# Patient Record
Sex: Female | Born: 1985 | Race: Black or African American | Hispanic: No | Marital: Married | Smoking: Never smoker
Health system: Southern US, Community
[De-identification: ages and names within clinical notes are randomized; demographics above are authoritative.]

## PROBLEM LIST (undated history)

## (undated) ENCOUNTER — Inpatient Hospital Stay (HOSPITAL_COMMUNITY): Payer: Self-pay

## (undated) ENCOUNTER — Emergency Department (HOSPITAL_COMMUNITY): Payer: No Typology Code available for payment source

## (undated) DIAGNOSIS — Z789 Other specified health status: Secondary | ICD-10-CM

## (undated) DIAGNOSIS — Z9889 Other specified postprocedural states: Secondary | ICD-10-CM

## (undated) HISTORY — PX: BREAST SURGERY: SHX581

---

## 2013-08-02 ENCOUNTER — Emergency Department (HOSPITAL_COMMUNITY): Payer: Medicaid Other

## 2013-08-02 ENCOUNTER — Encounter (HOSPITAL_COMMUNITY): Payer: Self-pay | Admitting: Physical Medicine and Rehabilitation

## 2013-08-02 ENCOUNTER — Emergency Department (HOSPITAL_COMMUNITY)
Admission: EM | Admit: 2013-08-02 | Discharge: 2013-08-02 | Disposition: A | Discharge status: Home / Self Care | Payer: Medicaid Other | Attending: Emergency Medicine | Admitting: Emergency Medicine

## 2013-08-02 DIAGNOSIS — S298XXA Other specified injuries of thorax, initial encounter: Secondary | ICD-10-CM | POA: Insufficient documentation

## 2013-08-02 DIAGNOSIS — S161XXA Strain of muscle, fascia and tendon at neck level, initial encounter: Secondary | ICD-10-CM

## 2013-08-02 DIAGNOSIS — Y92410 Unspecified street and highway as the place of occurrence of the external cause: Secondary | ICD-10-CM | POA: Insufficient documentation

## 2013-08-02 DIAGNOSIS — Y9389 Activity, other specified: Secondary | ICD-10-CM | POA: Insufficient documentation

## 2013-08-02 DIAGNOSIS — S139XXA Sprain of joints and ligaments of unspecified parts of neck, initial encounter: Secondary | ICD-10-CM | POA: Insufficient documentation

## 2013-08-02 DIAGNOSIS — S3981XA Other specified injuries of abdomen, initial encounter: Secondary | ICD-10-CM | POA: Insufficient documentation

## 2013-08-02 DIAGNOSIS — Z3202 Encounter for pregnancy test, result negative: Secondary | ICD-10-CM | POA: Insufficient documentation

## 2013-08-02 DIAGNOSIS — S8000XA Contusion of unspecified knee, initial encounter: Secondary | ICD-10-CM | POA: Insufficient documentation

## 2013-08-02 DIAGNOSIS — S8002XA Contusion of left knee, initial encounter: Secondary | ICD-10-CM

## 2013-08-02 LAB — CBC WITH DIFFERENTIAL/PLATELET
Basophils Absolute: 0 10*3/uL (ref 0.0–0.1)
Hemoglobin: 11.8 g/dL — ABNORMAL LOW (ref 12.0–15.0)
Lymphocytes Relative: 30 % (ref 12–46)
Lymphs Abs: 1.3 10*3/uL (ref 0.7–4.0)
Neutro Abs: 2.5 10*3/uL (ref 1.7–7.7)
Neutrophils Relative %: 59 % (ref 43–77)
Platelets: 241 10*3/uL (ref 150–400)
RBC: 3.7 MIL/uL — ABNORMAL LOW (ref 3.87–5.11)
RDW: 12.5 % (ref 11.5–15.5)
WBC: 4.2 10*3/uL (ref 4.0–10.5)

## 2013-08-02 LAB — POCT I-STAT, CHEM 8
Calcium, Ion: 1.2 mmol/L (ref 1.12–1.23)
Chloride: 104 mEq/L (ref 96–112)
Glucose, Bld: 84 mg/dL (ref 70–99)
HCT: 36 % (ref 36.0–46.0)
Hemoglobin: 12.2 g/dL (ref 12.0–15.0)
Potassium: 3.9 mEq/L (ref 3.5–5.1)

## 2013-08-02 LAB — URINALYSIS, ROUTINE W REFLEX MICROSCOPIC
Bilirubin Urine: NEGATIVE
Hgb urine dipstick: NEGATIVE
Leukocytes, UA: NEGATIVE
Nitrite: NEGATIVE
Specific Gravity, Urine: 1.017 (ref 1.005–1.030)
Urobilinogen, UA: 0.2 mg/dL (ref 0.0–1.0)
pH: 5.5 (ref 5.0–8.0)

## 2013-08-02 LAB — PREGNANCY, URINE: Preg Test, Ur: NEGATIVE

## 2013-08-02 MED ORDER — SODIUM CHLORIDE 0.9 % IV BOLUS (SEPSIS)
500.0000 mL | Freq: Once | INTRAVENOUS | Status: AC
  Administered 2013-08-02: 500 mL via INTRAVENOUS

## 2013-08-02 MED ORDER — OXYCODONE-ACETAMINOPHEN 5-325 MG PO TABS: 1.0000 | ORAL_TABLET | Freq: Four times a day (QID) | ORAL | Status: DC | PRN

## 2013-08-02 MED ORDER — IBUPROFEN 600 MG PO TABS: 600.0000 mg | ORAL_TABLET | Freq: Four times a day (QID) | ORAL | Status: DC | PRN

## 2013-08-02 MED ORDER — IOHEXOL 300 MG/ML  SOLN
100.0000 mL | Freq: Once | INTRAMUSCULAR | Status: AC | PRN
  Administered 2013-08-02: 100 mL via INTRAVENOUS
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
## 2013-11-05 NOTE — L&D Delivery Note (Signed)
Delivery Note At 9:43 PM a viable and healthy female was delivered via Vaginal, Spontaneous Delivery (Presentation: Left Occiput Anterior).  APGAR: 8, 9; weight  P.   Placenta status: Intact, Spontaneous.  Cord: 3 vessels with the following complications: None.    Anesthesia: Epidural  Episiotomy: None Lacerations: 2nd degree;Perineal Suture Repair: 3.0 vicryl rapide Est. Blood Loss (mL): 400  Mom to postpartum.  Baby to Couplet care / Skin to Skin.  Bovard-Stuckert, Rachel Atkinson 10/18/2014, 10:07 PM  O+/RI/Tdap in PNC/Br/ Contra?

## 2014-01-09 ENCOUNTER — Inpatient Hospital Stay (HOSPITAL_COMMUNITY): Payer: Medicaid Other

## 2014-01-09 ENCOUNTER — Inpatient Hospital Stay (HOSPITAL_COMMUNITY)
Admission: AD | Admit: 2014-01-09 | Discharge: 2014-01-09 | Disposition: A | Discharge status: Home / Self Care | Payer: Self-pay | Source: Ambulatory Visit | Attending: Obstetrics & Gynecology | Admitting: Obstetrics & Gynecology

## 2014-01-09 ENCOUNTER — Encounter (HOSPITAL_COMMUNITY): Payer: Self-pay

## 2014-01-09 DIAGNOSIS — M545 Low back pain, unspecified: Secondary | ICD-10-CM | POA: Insufficient documentation

## 2014-01-09 DIAGNOSIS — O4690 Antepartum hemorrhage, unspecified, unspecified trimester: Secondary | ICD-10-CM

## 2014-01-09 DIAGNOSIS — N9489 Other specified conditions associated with female genital organs and menstrual cycle: Secondary | ICD-10-CM

## 2014-01-09 DIAGNOSIS — O26859 Spotting complicating pregnancy, unspecified trimester: Secondary | ICD-10-CM | POA: Insufficient documentation

## 2014-01-09 DIAGNOSIS — R109 Unspecified abdominal pain: Secondary | ICD-10-CM | POA: Insufficient documentation

## 2014-01-09 LAB — URINE MICROSCOPIC-ADD ON

## 2014-01-09 LAB — CBC
HCT: 31.4 % — ABNORMAL LOW (ref 36.0–46.0)
Hemoglobin: 10.6 g/dL — ABNORMAL LOW (ref 12.0–15.0)
MCH: 31 pg (ref 26.0–34.0)
MCHC: 33.8 g/dL (ref 30.0–36.0)
MCV: 91.8 fL (ref 78.0–100.0)
Platelets: 271 10*3/uL (ref 150–400)
RBC: 3.42 MIL/uL — AB (ref 3.87–5.11)
RDW: 12.7 % (ref 11.5–15.5)
WBC: 3.3 10*3/uL — ABNORMAL LOW (ref 4.0–10.5)

## 2014-01-09 LAB — URINALYSIS, ROUTINE W REFLEX MICROSCOPIC
Bilirubin Urine: NEGATIVE
GLUCOSE, UA: NEGATIVE mg/dL
Ketones, ur: NEGATIVE mg/dL
Leukocytes, UA: NEGATIVE
Nitrite: NEGATIVE
PROTEIN: NEGATIVE mg/dL
Specific Gravity, Urine: 1.02 (ref 1.005–1.030)
Urobilinogen, UA: 0.2 mg/dL (ref 0.0–1.0)
pH: 5.5 (ref 5.0–8.0)

## 2014-01-09 LAB — POCT PREGNANCY, URINE: PREG TEST UR: POSITIVE — AB

## 2014-01-09 LAB — HCG, QUANTITATIVE, PREGNANCY: HCG, BETA CHAIN, QUANT, S: 117 m[IU]/mL — AB (ref <5)

## 2014-01-09 LAB — WET PREP, GENITAL
CLUE CELLS WET PREP: NONE SEEN
Trich, Wet Prep: NONE SEEN
Yeast Wet Prep HPF POC: NONE SEEN

## 2014-01-09 LAB — ABO/RH: ABO/RH(D): O POS

## 2014-01-09 NOTE — MAU Provider Note (Signed)
History     CSN: [id number]  Arrival date and time: 01/09/14 1351   First Provider Initiated Contact with Patient 01/09/14 1557      Chief Complaint  Patient presents with  . Vaginal Bleeding   HPI  Pt is G1P0 [redacted]w[redacted]d pregnant and pregnant with cramping that started last week.  Pt thought she was menstrual cramps and started Aleve. Pt missed her period for Feb with LMp 11/08/2013 and took 3 pregnancy tests - Wed , 2 Thursday.  Today pt started spotting today. Pt had IC last week. Pt has frequent urination and frequent bowel movements.  Pt has lower back pain that started this week.   History reviewed. No pertinent past medical history.  Past Surgical History  Procedure Laterality Date  . Breast surgery Left     History reviewed. No pertinent family history.  History  Substance Use Topics  . Smoking status: Never Smoker   . Smokeless tobacco: Not on file  . Alcohol Use: No    Allergies: No Known Allergies  Prescriptions prior to admission  Medication Sig Dispense Refill  . naproxen sodium (ANAPROX) 220 MG tablet Take 440 mg by mouth 3 (three) times daily as needed (pain).        Review of Systems  Constitutional: Negative for fever and chills.  Gastrointestinal: Positive for abdominal pain. Negative for nausea, vomiting, diarrhea and constipation.  Genitourinary: Negative for dysuria.  Musculoskeletal: Positive for back pain.   Physical Exam   Blood pressure 114/55, pulse 52, temperature 98.7 F (37.1 C), temperature source Oral, resp. rate 18, height 5\' 6"  (1.676 m), weight 69.4 kg (153 lb), last menstrual period 11/08/2013.  Physical Exam  Vitals reviewed. Constitutional: She is oriented to person, place, and time. She appears well-developed and well-nourished. No distress.  HENT:  Head: Normocephalic.  Eyes: Pupils are equal, round, and reactive to light.  Neck: Normal range of motion. Neck supple.  Respiratory: Effort normal and breath sounds normal.  GI:  Soft. She exhibits no distension. There is no tenderness. There is no rebound.  Genitourinary:  Small amount of bright red blood in vault; cervix close, NT; uterus NSSC NT; adnexa without palpable enlargment or tenderness  Musculoskeletal: Normal range of motion.  Neurological: She is alert and oriented to person, place, and time.  Skin: Skin is warm and dry.  Psychiatric: She has a normal mood and affect.    MAU Course  Procedures Results for orders placed during the hospital encounter of 01/09/14 (from the past 24 hour(s))  URINALYSIS, ROUTINE W REFLEX MICROSCOPIC     Status: Abnormal   Collection Time    01/09/14  3:10 PM      Result Value Ref Range   Color, Urine YELLOW  YELLOW   APPearance CLEAR  CLEAR   Specific Gravity, Urine 1.020  1.005 - 1.030   pH 5.5  5.0 - 8.0   Glucose, UA NEGATIVE  NEGATIVE mg/dL   Hgb urine dipstick LARGE (*) NEGATIVE   Bilirubin Urine NEGATIVE  NEGATIVE   Ketones, ur NEGATIVE  NEGATIVE mg/dL   Protein, ur NEGATIVE  NEGATIVE mg/dL   Urobilinogen, UA 0.2  0.0 - 1.0 mg/dL   Nitrite NEGATIVE  NEGATIVE   Leukocytes, UA NEGATIVE  NEGATIVE  URINE MICROSCOPIC-ADD ON     Status: Abnormal   Collection Time    01/09/14  3:10 PM      Result Value Ref Range   Squamous Epithelial / LPF FEW (*) RARE   WBC,  UA 0-2  <3 WBC/hpf   RBC / HPF 0-2  <3 RBC/hpf   Bacteria, UA FEW (*) RARE  POCT PREGNANCY, URINE     Status: Abnormal   Collection Time    01/09/14  3:20 PM      Result Value Ref Range   Preg Test, Ur POSITIVE (*) NEGATIVE  HCG, QUANTITATIVE, PREGNANCY     Status: Abnormal   Collection Time    01/09/14  4:15 PM      Result Value Ref Range   hCG, Beta Chain, Quant, S 117 (*) <5 mIU/mL  ABO/RH     Status: None   Collection Time    01/09/14  4:15 PM      Result Value Ref Range   ABO/RH(D) O POS    CBC     Status: Abnormal   Collection Time    01/09/14  4:15 PM      Result Value Ref Range   WBC 3.3 (*) 4.0 - 10.5 K/uL   RBC 3.42 (*) 3.87 -  5.11 MIL/uL   Hemoglobin 10.6 (*) 12.0 - 15.0 g/dL   HCT 24.431.4 (*) 01.036.0 - 27.246.0 %   MCV 91.8  78.0 - 100.0 fL   MCH 31.0  26.0 - 34.0 pg   MCHC 33.8  30.0 - 36.0 g/dL   RDW 53.612.7  64.411.5 - 03.415.5 %   Platelets 271  150 - 400 K/uL  WET PREP, GENITAL     Status: Abnormal   Collection Time    01/09/14  4:25 PM      Result Value Ref Range   Yeast Wet Prep HPF POC NONE SEEN  NONE SEEN   Trich, Wet Prep NONE SEEN  NONE SEEN   Clue Cells Wet Prep HPF POC NONE SEEN  NONE SEEN   WBC, Wet Prep HPF POC FEW (*) NONE SEEN  No results found.  [place] Ob Comp Less 14 Wks  01/09/2014   CLINICAL DATA:  Pelvic cramping and spotting. Early pregnancy. Beta HCG of 117.  EXAM: OBSTETRIC <14 WK US AND TRANSVAGINAL OB US  TECHNIQUE: Both transabdominal and transvaginal ultrasound examinations were performed for complete evaluation of the gestation as well as the maternal uterus, adnexal regions, and pelvic cul-de-sac. Transvaginal technique was performed to assess early pregnancy.  COMPARISON:  No priors.  FINDINGS: Intrauterine gestational sac: None.  Yolk sac:  None.  Embryo:  None.  Cardiac Activity: None.  Maternal uterus/adnexae: Retroverted uterus. Bilateral ovaries are normal in appearance with multiple normal follicles. Trace amount of free fluid adjacent to the left ovary. Immediately adjacent to the with left ovary there is also a small isoechoic mass measuring approximately 2.1 x 1.3 x 1.6 cm, which has some internal vascularity. Real-time imaging of this lesion was observed with direct pressure applied to the lesion with the probe, and the lesion was quite flexible; per report from the sonographer, the patient was not significantly tender over this lesion.  IMPRESSION: 1. No viable IUP identified at this time. 2. Trace amount of free fluid adjacent to the left ovary. In addition, there is a small soft tissue lesion in the left adnexal region immediately adjacent to the left ovary which appears slightly hypervascular. This  is nonspecific, but the possibility of an ectopic pregnancy in this region is difficult to entirely exclude. Clinical correlation is recommended, with consideration for repeat pelvic ultrasound in the near future should the patient's symptoms worsen.   Electronically Signed   By: Trudie Reedaniel  Entrikin  M.D.   On: 01/09/2014 18:12   US Ob Transvaginal  01/09/2014   CLINICAL DATA:  Pelvic cramping and spotting. Early pregnancy. Beta HCG of 117.  EXAM: OBSTETRIC <14 WK [place] AND TRANSVAGINAL OB US  TECHNIQUE: Both transabdominal and transvaginal ultrasound examinations were performed for complete evaluation of the gestation as well as the maternal uterus, adnexal regions, and pelvic cul-de-sac. Transvaginal technique was performed to assess early pregnancy.  COMPARISON:  No priors.  FINDINGS: Intrauterine gestational sac: None.  Yolk sac:  None.  Embryo:  None.  Cardiac Activity: None.  Maternal uterus/adnexae: Retroverted uterus. Bilateral ovaries are normal in appearance with multiple normal follicles. Trace amount of free fluid adjacent to the left ovary. Immediately adjacent to the with left ovary there is also a small isoechoic mass measuring approximately 2.1 x 1.3 x 1.6 cm, which has some internal vascularity. Real-time imaging of this lesion was observed with direct pressure applied to the lesion with the probe, and the lesion was quite flexible; per report from the sonographer, the patient was not significantly tender over this lesion.  IMPRESSION: 1. No viable IUP identified at this time. 2. Trace amount of free fluid adjacent to the left ovary. In addition, there is a small soft tissue lesion in the left adnexal region immediately adjacent to the left ovary which appears slightly hypervascular. This is nonspecific, but the possibility of an ectopic pregnancy in this region is difficult to entirely exclude. Clinical correlation is recommended, with consideration for repeat pelvic ultrasound in the near future should  the patient's symptoms worsen.   Electronically Signed   By: Trudie Reed M.D.   On: 01/09/2014 18:12   Care turned over to [facility] Rash, NP  O positive blood type Consulted with Dr. Debroah Loop; will plan to have patient return in 48 hours for repeat beta hcg.  Discussed the US findings with the patient and her husband; risks discussed at length and questions answered. Pt will return in 48 hours for repeat beta hcg and sooner if pain increases or changes.   Assessment and Plan   A:  1. Vaginal bleeding in pregnancy   2. Adnexal mass     P:  Discharge home in stable condition Ectopic instructions discussed at length Pelvic rest Return in 48 hours for repeat beta hcg  Return with any increase in pain Support given   LINEBERRY,SUSAN 01/09/2014, 4:01 PM   Iona Hansen Olubunmi Rothenberger, NP 01/09/2014 7:25 PM

## 2014-01-09 NOTE — MAU Note (Signed)
Pt stated she missed period lst month and took 3 pregnancy test and all were positive. Started having some spotting this morning. C/o of some cramping as well.

## 2014-01-10 ENCOUNTER — Encounter (HOSPITAL_COMMUNITY): Payer: Self-pay | Admitting: *Deleted

## 2014-01-10 ENCOUNTER — Inpatient Hospital Stay (HOSPITAL_COMMUNITY)
Admission: AD | Admit: 2014-01-10 | Discharge: 2014-01-10 | Disposition: A | Discharge status: Home / Self Care | Payer: Self-pay | Source: Ambulatory Visit | Attending: Obstetrics & Gynecology | Admitting: Obstetrics & Gynecology

## 2014-01-10 DIAGNOSIS — O200 Threatened abortion: Secondary | ICD-10-CM | POA: Insufficient documentation

## 2014-01-10 DIAGNOSIS — R109 Unspecified abdominal pain: Secondary | ICD-10-CM | POA: Insufficient documentation

## 2014-01-10 HISTORY — DX: Other specified health status: Z78.9

## 2014-01-10 MED ORDER — PROMETHAZINE HCL 25 MG PO TABS: 25.0000 mg | ORAL_TABLET | Freq: Once | ORAL | Status: DC

## 2014-01-10 MED ORDER — KETOROLAC TROMETHAMINE 60 MG/2ML IM SOLN
60.0000 mg | Freq: Once | INTRAMUSCULAR | Status: AC
  Administered 2014-01-10: 60 mg via INTRAMUSCULAR
  Filled 2014-01-10: qty 2

## 2014-01-10 MED ORDER — ACETAMINOPHEN-CODEINE #3 300-30 MG PO TABS: 1.0000 | ORAL_TABLET | Freq: Four times a day (QID) | ORAL | Status: DC | PRN

## 2014-01-10 MED ORDER — PROMETHAZINE HCL 25 MG PO TABS
25.0000 mg | ORAL_TABLET | Freq: Once | ORAL | Status: AC
  Administered 2014-01-10: 25 mg via ORAL
  Filled 2014-01-10: qty 1

## 2014-01-10 MED ORDER — OXYCODONE-ACETAMINOPHEN 5-325 MG PO TABS
1.0000 | ORAL_TABLET | Freq: Once | ORAL | Status: AC
Start: 2014-01-10 — End: 2014-01-10
  Administered 2014-01-10: 1 via ORAL
  Filled 2014-01-10: qty 1

## 2014-01-10 NOTE — MAU Provider Note (Signed)
  History     CSN: [id number]  Arrival date and time: 01/10/14 [id number]   First Provider Initiated Contact with Patient 01/10/14 0407      Chief Complaint  Patient presents with  . Abdominal Pain  . Abdominal Cramping   HPI Comments: Adeli Dapaah-Ntiamoah 28 y.o. G1P0 presents to MAU with low pelvic pain. She was here 8 hours ago and had work up for threatened SAB. See that note. She was not in much pain then but now her pain is '10" on 1-10 scale. She has not taken any thing for pain. Her ultrasound showed no IUP and some ? Left adnexal issues. She was sent home and told to return if her pain worsened.                                               Abdominal Pain  Abdominal Cramping    Past Medical History  Diagnosis Date  . Medical history non-contributory     Past Surgical History  Procedure Laterality Date  . Breast surgery Left     Family History  Problem Relation Age of Onset  . Alcohol abuse Neg Hx     History  Substance Use Topics  . Smoking status: Never Smoker   . Smokeless tobacco: Not on file  . Alcohol Use: No    Allergies: No Known Allergies  No prescriptions prior to admission    Review of Systems  Constitutional: Negative.   Eyes: Negative.   Respiratory: Negative.   Cardiovascular: Negative.   Gastrointestinal: Positive for abdominal pain.  Genitourinary: Negative.   Skin: Negative.   Neurological: Negative.   Psychiatric/Behavioral: Negative.    Physical Exam   Blood pressure 110/71, pulse 76, temperature 98.1 F (36.7 C), resp. rate 20, height 5' 6.5" (1.689 m), weight 68.947 kg (152 lb), last menstrual period 11/08/2013.  Physical Exam  Constitutional: She appears well-developed and well-nourished. She appears distressed.  HENT:  Head: Normocephalic and atraumatic.  GI: There is tenderness.  Genitourinary:  Genital:external negative Vaginal:moderate amount blood with clots Cervix: closed Bimanual:uterus  tender   Musculoskeletal: Normal range of motion.  Neurological: She is alert.  Skin: Skin is warm.  Psychiatric: She has a normal mood and affect. Her behavior is normal. Judgment normal.    MAU Course  Procedures  MDM  Toradol 60 mg IM pain went from 10 down to 8  Dr Debroah LoopArnold called and he reviewed the ultrasound and examined patient and advised home with precautions  Assessment and Plan   A: Threatened miscarriage  P: Pt has orders to return in 48 hours to repeat the BHCG Pelvic rest and fluids Tylenol # 3/ phenergan for home  Carolynn ServeBarefoot, Tamyrah Burbage Miller 01/10/2014, 4:19 AM

## 2014-01-10 NOTE — Progress Notes (Signed)
Rachel RodneyLinda Barefoot NP in earlier to discuss d/c plan. Written and verbal d/c instructions given and understanding voiced.

## 2014-01-10 NOTE — MAU Note (Signed)
Pain on and off in stomach for a week. Worse this morning. Some vag bleeding

## 2014-01-10 NOTE — Discharge Instructions (Signed)
Threatened Miscarriage °Bleeding during the first 20 weeks of pregnancy is common. This is sometimes called a threatened miscarriage. This is a pregnancy that is threatening to end before the twentieth week of pregnancy. Often this bleeding stops with bed rest or decreased activities as suggested by your caregiver and the pregnancy continues without any more problems. You may be asked to not have sexual intercourse, have orgasms or use tampons until further notice. Sometimes a threatened miscarriage can progress to a complete or incomplete miscarriage. This may or may not require further treatment. Some miscarriages occur before a woman misses a menstrual period and knows she is pregnant. °Miscarriages occur in 15 to 20% of all pregnancies and usually occur during the first 13 weeks of the pregnancy. The exact cause of a miscarriage is usually never known. A miscarriage is natures way of ending a pregnancy that is abnormal or would not make it to term. There are some things that may put you at risk to have a miscarriage, such as: °· Hormone problems. °· Infection of the uterus or cervix. °· Chronic illness, diabetes for example, especially if it is not controlled. °· Abnormal shaped uterus. °· Fibroids in the uterus. °· Incompetent cervix (the cervix is too weak to hold the baby). °· Smoking. °· Drinking too much alcohol. It's best not to drink any alcohol when you are pregnant. °· Taking illegal drugs. °TREATMENT  °When a miscarriage becomes complete and all products of conception (all the tissue in the uterus) have been passed, often no treatment is needed. If you think you passed tissue, save it in a container and take it to your doctor for evaluation. If the miscarriage is incomplete (parts of the fetus or placenta remain in the uterus), further treatment may be needed. The most common reason for further treatment is continued bleeding (hemorrhage) because pregnancy tissue did not pass out of the uterus. This  often occurs if a miscarriage is incomplete. Tissue left behind may also become infected. Treatment usually is dilatation and curettage (the removal of the remaining products of pregnancy. This can be done by a simple sucking procedure (suction curettage) or a simple scraping of the inside of the uterus. This may be done in the hospital or in the caregiver's office. This is only done when your caregiver knows that there is no chance for the pregnancy to proceed to term. This is determined by physical examination, negative pregnancy test, falling pregnancy hormone count and/or, an ultrasound revealing a dead fetus. °Miscarriages are often a very emotional time for prospective mothers and fathers. This is not you or your partners fault. It did not occur because of an inadequacy in you or your partner. Nearly all miscarriages occur because the pregnancy has started off wrongly. At least half of these pregnancies have a chromosomal abnormality. It is almost always not inherited. Others may have developmental problems with the fetus or placenta. This does not always show up even when the products miscarried are studied under the microscope. The miscarriage is nearly always not your fault and it is not likely that you could have prevented it from happening. If you are having emotional and grieving problems, talk to your health care provider and even seek counseling, if necessary, before getting pregnant again. You can begin trying for another pregnancy as soon as your caregiver says it is OK. °HOME CARE INSTRUCTIONS  °· Your caregiver may order bed rest depending on how much bleeding and cramping you are having. You may be limited   to only getting up to go to the bathroom. You may be allowed to continue light activity. You may need to make arrangements for the care of your other children and for any other responsibilities. °· Keep track of the number of pads you use each day, how often you have to change pads and how  saturated (soaked) they are. Record this information. °· DO NOT USE TAMPONS. Do not douche, have sexual intercourse or orgasms until approved by your caregiver. °· You may receive a follow up appointment for re-evaluation of your pregnancy and a repeat blood test. Re-evaluation often occurs after 2 days and again in 4 to 6 weeks. It is very important that you follow-up in the recommended time period. °· If you are Rh negative and the father is Rh positive or you do not know the fathers' blood type, you may receive a shot (Rh immune globulin) to help prevent abnormal antibodies that can develop and affect the baby in any future pregnancies. °SEEK IMMEDIATE MEDICAL CARE IF: °· You have severe cramps in your stomach, back, or abdomen. °· You have a sudden onset of severe pain in the lower part of your abdomen. °· You develop chills. °· You run an unexplained temperature of 101° F (38.3° C) or higher. °· You pass large clots or tissue. Save any tissue for your caregiver to inspect. °· Your bleeding increases or you become light-headed, weak, or have fainting episodes. °· You have a gush of fluid from your vagina. °· You pass out. This could mean you have a tubal (ectopic) pregnancy. °Document Released: 10/22/2005 Document Revised: 01/14/2012 Document Reviewed: 06/07/2008 °ExitCare® Patient Information ©2014 [employer]. ° ° °Pelvic Rest °Pelvic rest is sometimes recommended for women when:  °· The placenta is partially or completely covering the opening of the cervix (placenta previa). °· There is bleeding between the uterine wall and the amniotic sac in the first trimester (subchorionic hemorrhage). °· The cervix begins to open without labor starting (incompetent cervix, cervical insufficiency). °· The labor is too early (preterm labor). °HOME CARE INSTRUCTIONS °· Do not have sexual intercourse, stimulation, or an orgasm. °· Do not use tampons, douche, or put anything in the vagina. °· Do not lift anything over 10  pounds (4.5 kg). °· Avoid strenuous activity or straining your pelvic muscles. °SEEK MEDICAL CARE IF:  °· You have any vaginal bleeding during pregnancy. Treat this as a potential emergency. °· You have cramping pain felt low in the stomach (stronger than menstrual cramps). °· You notice vaginal discharge (watery, mucus, or bloody). °· You have a low, dull backache. °· There are regular contractions or uterine tightening. °SEEK IMMEDIATE MEDICAL CARE IF: °You have vaginal bleeding and have placenta previa.  °Document Released: 02/16/2011 Document Revised: 01/14/2012 Document Reviewed: 02/16/2011 °ExitCare® Patient Information ©2014 [employer]. ° °

## 2014-01-11 ENCOUNTER — Inpatient Hospital Stay (HOSPITAL_COMMUNITY)
Admission: AD | Admit: 2014-01-11 | Discharge: 2014-01-11 | Disposition: A | Discharge status: Home / Self Care | Payer: Medicaid Other | Source: Ambulatory Visit | Attending: Obstetrics & Gynecology | Admitting: Obstetrics & Gynecology

## 2014-01-11 DIAGNOSIS — O039 Complete or unspecified spontaneous abortion without complication: Secondary | ICD-10-CM | POA: Insufficient documentation

## 2014-01-11 LAB — GC/CHLAMYDIA PROBE AMP
CT Probe RNA: NEGATIVE
GC Probe RNA: NEGATIVE

## 2014-01-11 LAB — HCG, QUANTITATIVE, PREGNANCY: hCG, Beta Chain, Quant, S: 31 m[IU]/mL — ABNORMAL HIGH (ref <5)

## 2014-01-11 NOTE — MAU Provider Note (Signed)
HPI:  [name] is a 28 y.o. female G1P0 at 4814w1d who presents from a repeat beta hcg. Pt was here on 3/7 for bleeding and spotting; quant was 117 and US showed a questionable left adnexal mass. Pt presented to MAU again with increased pain on 3/8 (approximently 8 hours after she initially left). She was given pain medication and told to come back today for repeat beta hcg level. Currently she denies pain, however has continued to have mild bleeding.    Objective:  GENERAL: Well-developed, well-nourished female in no acute distress, pt is tearful  HEENT: Normocephalic, atraumatic.   LUNGS: Effort normal HEART: Regular rate  SKIN: Warm, dry and without erythema PSYCH: Normal mood and affect  Filed Vitals:   01/11/14 1543 01/11/14 1722  BP: 104/45 115/61  Pulse: 53 55  Temp: 99.3 F (37.4 C)   TempSrc: Oral   Resp: 18 16     [place] Ob Comp Less 14 Wks  01/09/2014   CLINICAL DATA:  Pelvic cramping and spotting. Early pregnancy. Beta HCG of 117.  EXAM: OBSTETRIC <14 WK US AND TRANSVAGINAL OB US  TECHNIQUE: Both transabdominal and transvaginal ultrasound examinations were performed for complete evaluation of the gestation as well as the maternal uterus, adnexal regions, and pelvic cul-de-sac. Transvaginal technique was performed to assess early pregnancy.  COMPARISON:  No priors.  FINDINGS: Intrauterine gestational sac: None.  Yolk sac:  None.  Embryo:  None.  Cardiac Activity: None.  Maternal uterus/adnexae: Retroverted uterus. Bilateral ovaries are normal in appearance with multiple normal follicles. Trace amount of free fluid adjacent to the left ovary. Immediately adjacent to the with left ovary there is also a small isoechoic mass measuring approximately 2.1 x 1.3 x 1.6 cm, which has some internal vascularity. Real-time imaging of this lesion was observed with direct pressure applied to the lesion with the probe, and the lesion was quite flexible; per report from the sonographer,  the patient was not significantly tender over this lesion.  IMPRESSION: 1. No viable IUP identified at this time. 2. Trace amount of free fluid adjacent to the left ovary. In addition, there is a small soft tissue lesion in the left adnexal region immediately adjacent to the left ovary which appears slightly hypervascular. This is nonspecific, but the possibility of an ectopic pregnancy in this region is difficult to entirely exclude. Clinical correlation is recommended, with consideration for repeat pelvic ultrasound in the near future should the patient's symptoms worsen.   Electronically Signed   By: Rachel Reedaniel  Atkinson M.D.   On: 01/09/2014 18:12   [place] Ob Transvaginal  01/09/2014   CLINICAL DATA:  Pelvic cramping and spotting. Early pregnancy. Beta HCG of 117.  EXAM: OBSTETRIC <14 WK US AND TRANSVAGINAL OB US  TECHNIQUE: Both transabdominal and transvaginal ultrasound examinations were performed for complete evaluation of the gestation as well as the maternal uterus, adnexal regions, and pelvic cul-de-sac. Transvaginal technique was performed to assess early pregnancy.  COMPARISON:  No priors.  FINDINGS: Intrauterine gestational sac: None.  Yolk sac:  None.  Embryo:  None.  Cardiac Activity: None.  Maternal uterus/adnexae: Retroverted uterus. Bilateral ovaries are normal in appearance with multiple normal follicles. Trace amount of free fluid adjacent to the left ovary. Immediately adjacent to the with left ovary there is also a small isoechoic mass measuring approximately 2.1 x 1.3 x 1.6 cm, which has some internal vascularity. Real-time imaging of this lesion was observed with direct pressure applied to the lesion with the probe,  and the lesion was quite flexible; per report from the sonographer, the patient was not significantly tender over this lesion.  IMPRESSION: 1. No viable IUP identified at this time. 2. Trace amount of free fluid adjacent to the left ovary. In addition, there is a small soft tissue  lesion in the left adnexal region immediately adjacent to the left ovary which appears slightly hypervascular. This is nonspecific, but the possibility of an ectopic pregnancy in this region is difficult to entirely exclude. Clinical correlation is recommended, with consideration for repeat pelvic ultrasound in the near future should the patient's symptoms worsen.   Electronically Signed   By: Rachel Atkinson M.D.   On: 01/09/2014 18:12   Results for orders placed during the hospital encounter of 01/11/14 (from the past 48 hour(s))  HCG, QUANTITATIVE, PREGNANCY     Status: Abnormal   Collection Time    01/11/14  3:55 PM      Result Value Ref Range   hCG, Beta Chain, Quant, S 31 (*) <5 mIU/mL   Comment:              GEST. AGE      CONC.  (mIU/mL)       <=1 WEEK        5 - 50         2 WEEKS       50 - 500         3 WEEKS       100 - 10,000         4 WEEKS     1,000 - 30,000         5 WEEKS     3,500 - 115,000       6-8 WEEKS     12,000 - 270,000        12 WEEKS     15,000 - 220,000                FEMALE AND NON-PREGNANT FEMALE:         LESS THAN 5 mIU/mL    MDM Beta Hcg level  Consulted with Dr. Marice Atkinson; ok to have patient follow up in the clinic in 1 week.   A: Vaginal bleeding in pregnancy      SAB in progress      O positive blood type   P: Discharge home in stable condition      Follow up in the clinic in 1 week for repeat beta hcg level      Bleeding precautions discussed      Return to MAU as needed, if symptoms worsen       Support given    Rachel Hansen Zen Cedillos, NP 01/11/2014 7:23 PM

## 2014-01-11 NOTE — MAU Note (Signed)
Pt here today for repeat BHCG.  Pt denies pain, states she is bleeding like a period, has changed 2 pads today.

## 2014-01-13 ENCOUNTER — Telehealth: Payer: Self-pay | Admitting: Obstetrics and Gynecology

## 2014-01-13 ENCOUNTER — Encounter: Payer: Self-pay | Admitting: Obstetrics and Gynecology

## 2014-01-13 NOTE — Telephone Encounter (Signed)
Called number and it was not a working number. Sent her a Psychiatric nursecertified letter.

## 2014-01-17 ENCOUNTER — Encounter: Payer: Self-pay | Admitting: *Deleted

## 2014-01-18 ENCOUNTER — Telehealth: Payer: Self-pay | Admitting: Obstetrics & Gynecology

## 2014-01-18 ENCOUNTER — Other Ambulatory Visit: Payer: Self-pay

## 2014-01-18 NOTE — Telephone Encounter (Signed)
Phone is not on. Sent patient certified letter about making an appointment for lab work.

## 2014-02-13 ENCOUNTER — Encounter (HOSPITAL_COMMUNITY): Payer: Self-pay | Admitting: *Deleted

## 2014-02-13 ENCOUNTER — Inpatient Hospital Stay (HOSPITAL_COMMUNITY)
Admission: AD | Admit: 2014-02-13 | Discharge: 2014-02-13 | Disposition: A | Discharge status: Home / Self Care | Payer: Medicaid Other | Source: Ambulatory Visit | Attending: Obstetrics & Gynecology | Admitting: Obstetrics & Gynecology

## 2014-02-13 DIAGNOSIS — O039 Complete or unspecified spontaneous abortion without complication: Secondary | ICD-10-CM | POA: Insufficient documentation

## 2014-02-13 DIAGNOSIS — O09299 Supervision of pregnancy with other poor reproductive or obstetric history, unspecified trimester: Secondary | ICD-10-CM

## 2014-02-13 NOTE — MAU Provider Note (Signed)

## 2014-02-13 NOTE — MAU Note (Signed)
Pt presents for pregnancy test. Denies any vaginal bleeding or cramping

## 2014-02-13 NOTE — MAU Provider Note (Signed)
Rachel Atkinson Filed Vitals:   02/13/14 1739  BP: 106/46  Pulse: 63  Resp: 20   HPI:  Pt had early SAB ending on 01/10/14.  HCG levels went from 117>31 on 3/9.  She has not had any bleeding since SAB.  UPT quickly + today.  Here for verification.  No pain/problems  IMP:  Most likely new pregnancy, ~ 4 weeks  [facility]s verification in a few weeks; F/U PRN problems

## 2014-02-15 LAB — POCT PREGNANCY, URINE: PREG TEST UR: POSITIVE — AB

## 2014-02-24 ENCOUNTER — Encounter (HOSPITAL_COMMUNITY): Payer: Self-pay | Admitting: *Deleted

## 2014-02-24 ENCOUNTER — Inpatient Hospital Stay (HOSPITAL_COMMUNITY): Payer: Medicaid Other

## 2014-02-24 ENCOUNTER — Inpatient Hospital Stay (HOSPITAL_COMMUNITY)
Admission: AD | Admit: 2014-02-24 | Discharge: 2014-02-24 | Disposition: A | Discharge status: Home / Self Care | Payer: Medicaid Other | Source: Ambulatory Visit | Attending: Obstetrics & Gynecology | Admitting: Obstetrics & Gynecology

## 2014-02-24 DIAGNOSIS — R110 Nausea: Secondary | ICD-10-CM

## 2014-02-24 DIAGNOSIS — R109 Unspecified abdominal pain: Secondary | ICD-10-CM | POA: Insufficient documentation

## 2014-02-24 DIAGNOSIS — O210 Mild hyperemesis gravidarum: Secondary | ICD-10-CM | POA: Insufficient documentation

## 2014-02-24 DIAGNOSIS — O99891 Other specified diseases and conditions complicating pregnancy: Secondary | ICD-10-CM

## 2014-02-24 DIAGNOSIS — O30009 Twin pregnancy, unspecified number of placenta and unspecified number of amniotic sacs, unspecified trimester: Secondary | ICD-10-CM | POA: Insufficient documentation

## 2014-02-24 DIAGNOSIS — R1084 Generalized abdominal pain: Secondary | ICD-10-CM

## 2014-02-24 DIAGNOSIS — D649 Anemia, unspecified: Secondary | ICD-10-CM | POA: Insufficient documentation

## 2014-02-24 DIAGNOSIS — O9989 Other specified diseases and conditions complicating pregnancy, childbirth and the puerperium: Secondary | ICD-10-CM

## 2014-02-24 DIAGNOSIS — Z3490 Encounter for supervision of normal pregnancy, unspecified, unspecified trimester: Secondary | ICD-10-CM

## 2014-02-24 DIAGNOSIS — O99019 Anemia complicating pregnancy, unspecified trimester: Secondary | ICD-10-CM | POA: Insufficient documentation

## 2014-02-24 LAB — URINALYSIS, ROUTINE W REFLEX MICROSCOPIC
Bilirubin Urine: NEGATIVE
Glucose, UA: NEGATIVE mg/dL
Hgb urine dipstick: NEGATIVE
KETONES UR: NEGATIVE mg/dL
LEUKOCYTES UA: NEGATIVE
NITRITE: NEGATIVE
PH: 6 (ref 5.0–8.0)
Protein, ur: NEGATIVE mg/dL
Specific Gravity, Urine: 1.015 (ref 1.005–1.030)
Urobilinogen, UA: 0.2 mg/dL (ref 0.0–1.0)

## 2014-02-24 LAB — CBC
HEMATOCRIT: 31.2 % — AB (ref 36.0–46.0)
Hemoglobin: 10.7 g/dL — ABNORMAL LOW (ref 12.0–15.0)
MCH: 31.8 pg (ref 26.0–34.0)
MCHC: 34.3 g/dL (ref 30.0–36.0)
MCV: 92.9 fL (ref 78.0–100.0)
Platelets: 211 10*3/uL (ref 150–400)
RBC: 3.36 MIL/uL — ABNORMAL LOW (ref 3.87–5.11)
RDW: 12.4 % (ref 11.5–15.5)
WBC: 2.9 10*3/uL — ABNORMAL LOW (ref 4.0–10.5)

## 2014-02-24 LAB — OB RESULTS CONSOLE GC/CHLAMYDIA
Chlamydia: NEGATIVE
GC PROBE AMP, GENITAL: NEGATIVE

## 2014-02-24 LAB — WET PREP, GENITAL
Clue Cells Wet Prep HPF POC: NONE SEEN
TRICH WET PREP: NONE SEEN
WBC WET PREP: NONE SEEN
YEAST WET PREP: NONE SEEN

## 2014-02-24 LAB — HCG, QUANTITATIVE, PREGNANCY: HCG, BETA CHAIN, QUANT, S: 6314 m[IU]/mL — AB (ref <5)

## 2014-02-24 MED ORDER — OXYCODONE-ACETAMINOPHEN 5-325 MG PO TABS
1.0000 | ORAL_TABLET | Freq: Once | ORAL | Status: DC
  Filled 2014-02-24: qty 1

## 2014-02-24 MED ORDER — PROMETHAZINE HCL 25 MG PO TABS: 25.0000 mg | ORAL_TABLET | Freq: Four times a day (QID) | ORAL | Status: DC | PRN

## 2014-02-24 MED ORDER — PROMETHAZINE HCL 25 MG PO TABS
25.0000 mg | ORAL_TABLET | Freq: Once | ORAL | Status: AC
  Administered 2014-02-24: 25 mg via ORAL
  Filled 2014-02-24 (×2): qty 1

## 2014-02-24 NOTE — MAU Provider Note (Signed)
History     CSN: [id number]  Arrival date and time: 02/24/14 1530   First Provider Initiated Contact with Patient 02/24/14 1653      Chief Complaint  Patient presents with  . Abdominal Pain   HPI Comments: Rachel Atkinson 28 y.o. 381w2d G2P0010 presents to MAU for abdominal pain that started today and she rates on 1-10 scale as 6-7. She has taken no medications. She also has nausea and has no medications for that. She was scheduled to follow up in Clinic and she did not keep that appointment for follow up on SAB in early March. She says she was not told to avoid pregnany again and appears to have gotten pregnant again quickly.    Abdominal Pain Associated symptoms include nausea.      Past Medical History  Diagnosis Date  . Medical history non-contributory     Past Surgical History  Procedure Laterality Date  . Breast surgery Left     Family History  Problem Relation Age of Onset  . Alcohol abuse Neg Hx     History  Substance Use Topics  . Smoking status: Never Smoker   . Smokeless tobacco: Not on file  . Alcohol Use: No    Allergies: No Known Allergies  Prescriptions prior to admission  Medication Sig Dispense Refill  . Prenatal Vit-Fe Fumarate-FA (PRENATAL MULTIVITAMIN) TABS tablet Take 1 tablet by mouth daily at 12 noon.        Review of Systems  Constitutional: Negative.   HENT: Negative.   Eyes: Negative.   Respiratory: Negative.   Cardiovascular: Negative.   Gastrointestinal: Positive for nausea and abdominal pain.  Genitourinary: Negative.   Musculoskeletal: Negative.   Skin: Negative.   Neurological: Negative.   Psychiatric/Behavioral: Negative.    Physical Exam   Blood pressure 110/53, pulse 55, temperature 98.6 F (37 C), resp. rate 18, height 5\' 6"  (1.676 m), weight 68.493 kg (151 lb), last menstrual period 01/11/2014, not currently breastfeeding.  Physical Exam  Constitutional: She is oriented to person, place, and time. She appears  well-developed and well-nourished. No distress.  HENT:  Head: Normocephalic and atraumatic.  Eyes: Pupils are equal, round, and reactive to light.  GI: Soft. She exhibits no distension and no mass. There is no tenderness. There is no rebound and no guarding.  Genitourinary:  Genital:external negative Vaginal:thin white discharge Cervix:closed/ thick Bimanual:nontender   Musculoskeletal: Normal range of motion.  Neurological: She is alert and oriented to person, place, and time.  Skin: Skin is warm.  Psychiatric: She has a normal mood and affect. Her behavior is normal. Judgment and thought content normal.   Results for orders placed during the hospital encounter of 02/24/14 (from the past 24 hour(s))  URINALYSIS, ROUTINE W REFLEX MICROSCOPIC     Status: None   Collection Time    02/24/14  4:17 PM      Result Value Ref Range   Color, Urine YELLOW  YELLOW   APPearance CLEAR  CLEAR   Specific Gravity, Urine 1.015  1.005 - 1.030   pH 6.0  5.0 - 8.0   Glucose, UA NEGATIVE  NEGATIVE mg/dL   Hgb urine dipstick NEGATIVE  NEGATIVE   Bilirubin Urine NEGATIVE  NEGATIVE   Ketones, ur NEGATIVE  NEGATIVE mg/dL   Protein, ur NEGATIVE  NEGATIVE mg/dL   Urobilinogen, UA 0.2  0.0 - 1.0 mg/dL   Nitrite NEGATIVE  NEGATIVE   Leukocytes, UA NEGATIVE  NEGATIVE  CBC     Status: Abnormal  Collection Time    02/24/14  5:03 PM      Result Value Ref Range   WBC 2.9 (*) 4.0 - 10.5 K/uL   RBC 3.36 (*) 3.87 - 5.11 MIL/uL   Hemoglobin 10.7 (*) 12.0 - 15.0 g/dL   HCT 86.531.2 (*) 78.436.0 - 69.646.0 %   MCV 92.9  78.0 - 100.0 fL   MCH 31.8  26.0 - 34.0 pg   MCHC 34.3  30.0 - 36.0 g/dL   RDW 29.512.4  28.411.5 - 13.215.5 %   Platelets 211  150 - 400 K/uL  HCG, QUANTITATIVE, PREGNANCY     Status: Abnormal   Collection Time    02/24/14  5:03 PM      Result Value Ref Range   hCG, Beta Chain, Quant, S 6314 (*) <5 mIU/mL  WET PREP, GENITAL     Status: None   Collection Time    02/24/14  6:15 PM      Result Value Ref Range    Yeast Wet Prep HPF POC NONE SEEN  NONE SEEN   Trich, Wet Prep NONE SEEN  NONE SEEN   Clue Cells Wet Prep HPF POC NONE SEEN  NONE SEEN   WBC, Wet Prep HPF POC NONE SEEN  NONE SEEN    .  [place] Ob Transvaginal  02/24/2014   CLINICAL DATA:  Pelvic pain.  EXAM: TRANSVAGINAL OB ULTRASOUND  TECHNIQUE: Transvaginal ultrasound was performed for complete evaluation of the gestation as well as the maternal uterus, adnexal regions, and pelvic cul-de-sac.  COMPARISON:  01/09/2014.  FINDINGS: Intrauterine gestational sac: Present.  Yolk sac:  Present.  Embryo:  None.  Cardiac Activity: None.  Heart Rate: N/A  MSD: 9.3  mm   5 w   4 d     US EDC: 10/23/2014.  Maternal uterus/adnexae: Adjacent to the gestational sac described above there is a tiny round fluid collection within the endometrial canal measuring approximately 5 x 5 x 5 mm. In addition, there is a small subchorionic hemorrhage. The appearance of the ovaries is normal bilaterally (probable degenerating corpus luteum cyst in the left ovary incidentally noted). Trace volume of free fluid in the cul-de-sac.  IMPRESSION: 1. Single IUP identified. Estimated gestational age is 5 weeks and 4 days. There is a small subchorionic hemorrhage. In addition, there is a 5 mm fluid collection adjacent to the gestational sac, which could represent a second gestational sac, although this is nonspecific at this time. Attention on followup studies is suggested. 2. Trace volume of free fluid in the cul-de-sac.   Electronically Signed   By: Trudie Reedaniel  Entrikin M.D.   On: 02/24/2014 18:12    MAU Course  Procedures  MDM  CBC, BHCG, U/S Percocet/phenergan pt refused Wet prep/ gc/chlamydia  Assessment and Plan   A: Pregnancy ? Twin gestation Anemia Nausea  P: Phenergan 25 mg po q6 hrs Repeat U/S in 10 days/ will schedule outpt [facility]/ message sent to clinic Discussed foods rich in iron  Doralee AlbinoLinda M Ryder Chesmore 02/24/2014, 7:25 PM

## 2014-02-24 NOTE — Discharge Instructions (Signed)
Anemia, Nonspecific Anemia is a condition in which the concentration of red blood cells or hemoglobin in the blood is below normal. Hemoglobin is a substance in red blood cells that carries oxygen to the tissues of the body. Anemia results in not enough oxygen reaching these tissues.  CAUSES  Common causes of anemia include:   Excessive bleeding. Bleeding may be internal or external. This includes excessive bleeding from periods (in women) or from the intestine.   Poor nutrition.   Chronic kidney, thyroid, and liver disease.  Bone marrow disorders that decrease red blood cell production.  Cancer and treatments for cancer.  HIV, AIDS, and their treatments.  Spleen problems that increase red blood cell destruction.  Blood disorders.  Excess destruction of red blood cells due to infection, medicines, and autoimmune disorders. SIGNS AND SYMPTOMS   Minor weakness.   Dizziness.   Headache.  Palpitations.   Shortness of breath, especially with exercise.   Paleness.  Cold sensitivity.  Indigestion.  Nausea.  Difficulty sleeping.  Difficulty concentrating. Symptoms may occur suddenly or they may develop slowly.  DIAGNOSIS  Additional blood tests are often needed. These help your health care provider determine the best treatment. Your health care provider will check your stool for blood and look for other causes of blood loss.  TREATMENT  Treatment varies depending on the cause of the anemia. Treatment can include:   Supplements of iron, vitamin B12, or folic acid.   Hormone medicines.   A blood transfusion. This may be needed if blood loss is severe.   Hospitalization. This may be needed if there is significant continual blood loss.   Dietary changes.  Spleen removal. HOME CARE INSTRUCTIONS Keep all follow-up appointments. It often takes many weeks to correct anemia, and having your health care provider check on your condition and your response to  treatment is very important. SEEK IMMEDIATE MEDICAL CARE IF:   You develop extreme weakness, shortness of breath, or chest pain.   You become dizzy or have trouble concentrating.  You develop heavy vaginal bleeding.   You develop a rash.   You have bloody or black, tarry stools.   You faint.   You vomit up blood.   You vomit repeatedly.   You have abdominal pain.  You have a fever or persistent symptoms for more than 2 3 days.   You have a fever and your symptoms suddenly get worse.   You are dehydrated.  MAKE SURE YOU:  Understand these instructions.  Will watch your condition.  Will get help right away if you are not doing well or get worse. Document Released: 11/29/2004 Document Revised: 06/24/2013 Document Reviewed: 04/17/2013 [facility] SurgeryExitCare Patient Information 2014 [place], [place]. Nausea and Vomiting Nausea is a sick feeling that often comes before throwing up (vomiting). Vomiting is a reflex where stomach contents come out of your mouth. Vomiting can cause severe loss of body fluids (dehydration). Children and elderly adults can become dehydrated quickly, especially if they also have diarrhea. Nausea and vomiting are symptoms of a condition or disease. It is important to find the cause of your symptoms. CAUSES   Direct irritation of the stomach lining. This irritation can result from increased acid production (gastroesophageal reflux disease), infection, food poisoning, taking certain medicines (such as nonsteroidal anti-inflammatory drugs), alcohol use, or tobacco use.  Signals from the brain.These signals could be caused by a headache, heat exposure, an inner ear disturbance, increased pressure in the brain from injury, infection, a tumor, or a concussion,  pain, emotional stimulus, or metabolic problems.  An obstruction in the gastrointestinal tract (bowel obstruction).  Illnesses such as diabetes, hepatitis, gallbladder problems, appendicitis, kidney  problems, cancer, sepsis, atypical symptoms of a heart attack, or eating disorders.  Medical treatments such as chemotherapy and radiation.  Receiving medicine that makes you sleep (general anesthetic) during surgery. DIAGNOSIS Your caregiver may ask for tests to be done if the problems do not improve after a few days. Tests may also be done if symptoms are severe or if the reason for the nausea and vomiting is not clear. Tests may include:  Urine tests.  Blood tests.  Stool tests.  Cultures (to look for evidence of infection).  X-rays or other imaging studies. Test results can help your caregiver make decisions about treatment or the need for additional tests. TREATMENT You need to stay well hydrated. Drink frequently but in small amounts.You may wish to drink water, sports drinks, clear broth, or eat frozen ice pops or gelatin dessert to help stay hydrated.When you eat, eating slowly may help prevent nausea.There are also some antinausea medicines that may help prevent nausea. HOME CARE INSTRUCTIONS   Take all medicine as directed by your caregiver.  If you do not have an appetite, do not force yourself to eat. However, you must continue to drink fluids.  If you have an appetite, eat a normal diet unless your caregiver tells you differently.  Eat a variety of complex carbohydrates (rice, wheat, potatoes, bread), lean meats, yogurt, fruits, and vegetables.  Avoid high-fat foods because they are more difficult to digest.  Drink enough water and fluids to keep your urine clear or pale yellow.  If you are dehydrated, ask your caregiver for specific rehydration instructions. Signs of dehydration may include:  Severe thirst.  Dry lips and mouth.  Dizziness.  Dark urine.  Decreasing urine frequency and amount.  Confusion.  Rapid breathing or pulse. SEEK IMMEDIATE MEDICAL CARE IF:   You have blood or brown flecks (like coffee grounds) in your vomit.  You have black  or bloody stools.  You have a severe headache or stiff neck.  You are confused.  You have severe abdominal pain.  You have chest pain or trouble breathing.  You do not urinate at least once every 8 hours.  You develop cold or clammy skin.  You continue to vomit for longer than 24 to 48 hours.  You have a fever. MAKE SURE YOU:   Understand these instructions.  Will watch your condition.  Will get help right away if you are not doing well or get worse. Document Released: 10/22/2005 Document Revised: 01/14/2012 Document Reviewed: 03/21/2011 [facility]ExitCare Patient Information 2014 [place]ExitCare, MarylandLLC.

## 2014-02-24 NOTE — MAU Note (Signed)
Pain in lower abd started today, constant pain.  Has been having int pain in stomach when eats.

## 2014-02-25 LAB — GC/CHLAMYDIA PROBE AMP
CT Probe RNA: NEGATIVE
GC Probe RNA: NEGATIVE

## 2014-03-02 ENCOUNTER — Telehealth: Payer: Self-pay

## 2014-03-02 DIAGNOSIS — O3680X0 Pregnancy with inconclusive fetal viability, not applicable or unspecified: Secondary | ICD-10-CM

## 2014-03-02 NOTE — Telephone Encounter (Signed)
Pt. Called front desk stating she was supposed to have an ultrasound appointment in the clinic but was not called with a date. Informed pt. We do not do ultrasounds here in the clinic, however, they are done here in the hospital. After chart review, pt. Was supposed to be scheduled an ultrasound 10 days after 4/22. Ultrasound ordered and scheduled for 03/09/14 at 1600. Pt. Informed of ultrasound date, time and location and advised to arrive 15min early. Pt. Verbalized understanding.

## 2014-03-03 ENCOUNTER — Ambulatory Visit (HOSPITAL_COMMUNITY): Payer: Self-pay

## 2014-03-09 ENCOUNTER — Ambulatory Visit (HOSPITAL_COMMUNITY)
Admission: RE | Admit: 2014-03-09 | Discharge: 2014-03-09 | Disposition: A | Discharge status: Home / Self Care | Payer: Medicaid Other | Source: Ambulatory Visit | Attending: Obstetrics & Gynecology | Admitting: Obstetrics & Gynecology

## 2014-03-09 ENCOUNTER — Other Ambulatory Visit: Payer: Self-pay | Admitting: Obstetrics & Gynecology

## 2014-03-09 DIAGNOSIS — O209 Hemorrhage in early pregnancy, unspecified: Secondary | ICD-10-CM | POA: Diagnosis not present

## 2014-03-09 DIAGNOSIS — Z3689 Encounter for other specified antenatal screening: Secondary | ICD-10-CM | POA: Diagnosis not present

## 2014-03-09 DIAGNOSIS — O3680X0 Pregnancy with inconclusive fetal viability, not applicable or unspecified: Secondary | ICD-10-CM

## 2014-03-09 DIAGNOSIS — O208 Other hemorrhage in early pregnancy: Secondary | ICD-10-CM | POA: Insufficient documentation

## 2014-03-24 ENCOUNTER — Encounter: Payer: Self-pay | Admitting: General Practice

## 2014-04-07 ENCOUNTER — Other Ambulatory Visit: Payer: Self-pay | Admitting: Advanced Practice Midwife

## 2014-04-07 ENCOUNTER — Encounter: Payer: Self-pay | Admitting: Advanced Practice Midwife

## 2014-04-07 ENCOUNTER — Ambulatory Visit: Payer: Medicaid Other | Admitting: Advanced Practice Midwife

## 2014-04-07 ENCOUNTER — Other Ambulatory Visit (HOSPITAL_COMMUNITY)
Admission: RE | Admit: 2014-04-07 | Discharge: 2014-04-07 | Disposition: A | Discharge status: Home / Self Care | Payer: Medicaid Other | Source: Ambulatory Visit | Attending: Advanced Practice Midwife | Admitting: Advanced Practice Midwife

## 2014-04-07 VITALS — BP 107/61 | HR 57 | Wt 153.4 lb

## 2014-04-07 DIAGNOSIS — Z01419 Encounter for gynecological examination (general) (routine) without abnormal findings: Secondary | ICD-10-CM | POA: Insufficient documentation

## 2014-04-07 DIAGNOSIS — Z113 Encounter for screening for infections with a predominantly sexual mode of transmission: Secondary | ICD-10-CM | POA: Insufficient documentation

## 2014-04-07 DIAGNOSIS — O26899 Other specified pregnancy related conditions, unspecified trimester: Secondary | ICD-10-CM

## 2014-04-07 DIAGNOSIS — Z3682 Encounter for antenatal screening for nuchal translucency: Secondary | ICD-10-CM

## 2014-04-07 DIAGNOSIS — O09299 Supervision of pregnancy with other poor reproductive or obstetric history, unspecified trimester: Secondary | ICD-10-CM

## 2014-04-07 DIAGNOSIS — R109 Unspecified abdominal pain: Secondary | ICD-10-CM

## 2014-04-07 LAB — POCT URINALYSIS DIP (DEVICE)
Bilirubin Urine: NEGATIVE
Glucose, UA: NEGATIVE mg/dL
Hgb urine dipstick: NEGATIVE
Ketones, ur: NEGATIVE mg/dL
LEUKOCYTES UA: NEGATIVE
NITRITE: NEGATIVE
PROTEIN: NEGATIVE mg/dL
Specific Gravity, Urine: 1.015 (ref 1.005–1.030)
UROBILINOGEN UA: 0.2 mg/dL (ref 0.0–1.0)
pH: 7.5 (ref 5.0–8.0)

## 2014-04-07 LAB — OB RESULTS CONSOLE GBS: STREP GROUP B AG: NEGATIVE

## 2014-04-07 NOTE — Progress Notes (Signed)
First trimester screen scheduled for 04/12/14 at 1130. Patient advised to arrive 15 minutes early.

## 2014-04-07 NOTE — Progress Notes (Signed)
New OB visit. Routines reviewed. See SmartSet.  Subjective:    Rachel Atkinson is a G2P0010 [redacted]w[redacted]d being seen today for her first obstetrical visit.  Her obstetrical history is significant for Recent SAB. Conceived immediately after SAB. Patient does intend to breast feed. Pregnancy history fully reviewed.  Patient reports no complaints.  Filed Vitals:   04/07/14 1009  BP: 107/61  Pulse: 57  Weight: 69.582 kg (153 lb 6.4 oz)    HISTORY: OB History  Gravida Para Term Preterm AB SAB TAB Ectopic Multiple Living  2 0 0 0 1 1    0    # Outcome Date GA Lbr Len/2nd Weight Sex Delivery Anes PTL Lv  2 CUR           1 SAB              Past Medical History  Diagnosis Date  . Medical history non-contributory    Past Surgical History  Procedure Laterality Date  . Breast surgery Left    Family History  Problem Relation Age of Onset  . Alcohol abuse Neg Hx      Exam    Uterus:  Fundal Height: 12 cm  Pelvic Exam:    Perineum: No Hemorrhoids, Normal Perineum   Vulva: Bartholin's, Urethra, Skene's normal   Vagina:  normal mucosa, normal discharge   pH:    Cervix: no cervical motion tenderness and no lesions   Adnexa: normal adnexa and no mass, fullness, tenderness   Bony Pelvis: gynecoid  System: Breast:  normal appearance, no masses or tenderness   Skin: normal coloration and turgor, no rashes    Neurologic: oriented, no focal deficits   Extremities: normal strength, tone, and muscle mass   HEENT neck supple with midline trachea   Mouth/Teeth mucous membranes moist, pharynx normal without lesions   Neck supple and no masses   Cardiovascular: regular rate and rhythm, no murmurs or gallops   Respiratory:  appears well, vitals normal, no respiratory distress, acyanotic, normal RR, ear and throat exam is normal, neck free of mass or lymphadenopathy   Abdomen: soft, non-tender; bowel sounds normal; no masses,  no organomegaly   Urinary: urethral meatus normal       Assessment:    Pregnancy: G2P0010 Patient Active Problem List   Diagnosis Date Noted  . Abdominal pain complicating pregnancy, antepartum 04/07/2014    Recent SAB, conceived immediately after SAB    Plan:     Initial labs drawn. Prenatal vitamins. Problem list reviewed and updated. Genetic Screening discussed Integrated Screen: ordered.  Ultrasound discussed; fetal survey: ordered.  Follow up in 4 weeks. 50% of 30 min visit spent on counseling and coordination of care.     Aviva Signs 04/07/2014

## 2014-04-07 NOTE — Progress Notes (Signed)
Initial OB Packet given to patient. Discussed appropriate weight gain.  Patient left clinic without having new ob labs drawn. Will draw at next visit.

## 2014-04-07 NOTE — Patient Instructions (Signed)
Pregnancy - First Trimester  During sexual intercourse, millions of sperm go into the vagina. Only 1 sperm will penetrate and fertilize the female egg while it is in the Fallopian tube. One week later, the fertilized egg implants into the wall of the uterus. An embryo begins to develop into a baby. At 6 to 8 weeks, the eyes and face are formed and the heartbeat can be seen on ultrasound. At the end of 12 weeks (first trimester), all the baby's organs are formed. Now that you are pregnant, you will want to do everything you can to have a healthy baby. Two of the most important things are to get good prenatal care and follow your caregiver's instructions. Prenatal care is all the medical care you receive before the baby's birth. It is given to prevent, find, and treat problems during the pregnancy and childbirth.  PRENATAL EXAMS  · During prenatal visits, your weight, blood pressure, and urine are checked. This is done to make sure you are healthy and progressing normally during the pregnancy.  · A pregnant woman should gain 25 to 35 pounds during the pregnancy. However, if you are overweight or underweight, your caregiver will advise you regarding your weight.  · Your caregiver will ask and answer questions for you.  · Blood work, cervical cultures, other necessary tests, and a Pap test are done during your prenatal exams. These tests are done to check on your health and the probable health of your baby. Tests are strongly recommended and done for HIV with your permission. This is the virus that causes AIDS. These tests are done because medicines can be given to help prevent your baby from being born with this infection should you have been infected without knowing it. Blood work is also used to find out your blood type, previous infections, and follow your blood levels (hemoglobin).  · Low hemoglobin (anemia) is common during pregnancy. Iron and vitamins are given to help prevent this. Later in the pregnancy, blood  tests for diabetes will be done along with any other tests if any problems develop.  · You may need other tests to make sure you and the baby are doing well.  CHANGES DURING THE FIRST TRIMESTER   Your body goes through many changes during pregnancy. They vary from person to person. Talk to your caregiver about changes you notice and are concerned about. Changes can include:  · Your menstrual period stops.  · The egg and sperm carry the genes that determine what you look like. Genes from you and your partner are forming a baby. The female genes determine whether the baby is a boy or a girl.  · Your body increases in girth and you may feel bloated.  · Feeling sick to your stomach (nauseous) and throwing up (vomiting). If the vomiting is uncontrollable, call your caregiver.  · Your breasts will begin to enlarge and become tender.  · Your nipples may stick out more and become darker.  · The need to urinate more. Painful urination may mean you have a bladder infection.  · Tiring easily.  · Loss of appetite.  · Cravings for certain kinds of food.  · At first, you may gain or lose a couple of pounds.  · You may have changes in your emotions from day to day (excited to be pregnant or concerned something may go wrong with the pregnancy and baby).  · You may have more vivid and strange dreams.  HOME CARE INSTRUCTIONS   ·   It is very important to avoid all smoking, alcohol and non-prescribed drugs during your pregnancy. These affect the formation and growth of the baby. Avoid chemicals while pregnant to ensure the delivery of a healthy infant.  · Start your prenatal visits by the 12th week of pregnancy. They are usually scheduled monthly at first, then more often in the last 2 months before delivery. Keep your caregiver's appointments. Follow your caregiver's instructions regarding medicine use, blood and lab tests, exercise, and diet.  · During pregnancy, you are providing food for you and your baby. Eat regular, well-balanced  meals. Choose foods such as meat, fish, milk and other low fat dairy products, vegetables, fruits, and whole-grain breads and cereals. Your caregiver will tell you of the ideal weight gain.  · You can help morning sickness by keeping soda crackers at the bedside. Eat a couple before arising in the morning. You may want to use the crackers without salt on them.  · Eating 4 to 5 small meals rather than 3 large meals a day also may help the nausea and vomiting.  · Drinking liquids between meals instead of during meals also seems to help nausea and vomiting.  · A physical sexual relationship may be continued throughout pregnancy if there are no other problems. Problems may be early (premature) leaking of amniotic fluid from the membranes, vaginal bleeding, or belly (abdominal) pain.  · Exercise regularly if there are no restrictions. Check with your caregiver or physical therapist if you are unsure of the safety of some of your exercises. Greater weight gain will occur in the last 2 trimesters of pregnancy. Exercising will help:  · Control your weight.  · Keep you in shape.  · Prepare you for labor and delivery.  · Help you lose your pregnancy weight after you deliver your baby.  · Wear a good support or jogging bra for breast tenderness during pregnancy. This may help if worn during sleep too.  · Ask when prenatal classes are available. Begin classes when they are offered.  · Do not use hot tubs, steam rooms, or saunas.  · Wear your seat belt when driving. This protects you and your baby if you are in an accident.  · Avoid raw meat, uncooked cheese, cat litter boxes, and soil used by cats throughout the pregnancy. These carry germs that can cause birth defects in the baby.  · The first trimester is a good time to visit your dentist for your dental health. Getting your teeth cleaned is okay. Use a softer toothbrush and brush gently during pregnancy.  · Ask for help if you have financial, counseling, or nutritional needs  during pregnancy. Your caregiver will be able to offer counseling for these needs as well as refer you for other special needs.  · Do not take any medicines or herbs unless told by your caregiver.  · Inform your caregiver if there is any mental or physical domestic violence.  · Make a list of emergency phone numbers of family, friends, hospital, and police and fire departments.  · Write down your questions. Take them to your prenatal visit.  · Do not douche.  · Do not cross your legs.  · If you have to stand for long periods of time, rotate you feet or take small steps in a circle.  · You may have more vaginal secretions that may require a sanitary pad. Do not use tampons or scented sanitary pads.  MEDICINES AND DRUG USE IN PREGNANCY  ·   Take prenatal vitamins as directed. The vitamin should contain 1 milligram of folic acid. Keep all vitamins out of reach of children. Only a couple vitamins or tablets containing iron may be fatal to a baby or young child when ingested.  · Avoid use of all medicines, including herbs, over-the-counter medicines, not prescribed or suggested by your caregiver. Only take over-the-counter or prescription medicines for pain, discomfort, or fever as directed by your caregiver. Do not use aspirin, ibuprofen, or naproxen unless directed by your caregiver.  · Let your caregiver also know about herbs you may be using.  · Alcohol is related to a number of birth defects. This includes fetal alcohol syndrome. All alcohol, in any form, should be avoided completely. Smoking will cause low birth rate and premature babies.  · Street or illegal drugs are very harmful to the baby. They are absolutely forbidden. A baby born to an addicted mother will be addicted at birth. The baby will go through the same withdrawal an adult does.  · Let your caregiver know about any medicines that you have to take and for what reason you take them.  SEEK MEDICAL CARE IF:   You have any concerns or worries during your  pregnancy. It is better to call with your questions if you feel they cannot wait, rather than worry about them.  SEEK IMMEDIATE MEDICAL CARE IF:   · An unexplained oral temperature above 102° F (38.9° C) develops, or as your caregiver suggests.  · You have leaking of fluid from the vagina (birth canal). If leaking membranes are suspected, take your temperature and inform your caregiver of this when you call.  · There is vaginal spotting or bleeding. Notify your caregiver of the amount and how many pads are used.  · You develop a bad smelling vaginal discharge with a change in the color.  · You continue to feel sick to your stomach (nauseated) and have no relief from remedies suggested. You vomit blood or coffee ground-like materials.  · You lose more than 2 pounds of weight in 1 week.  · You gain more than 2 pounds of weight in 1 week and you notice swelling of your face, hands, feet, or legs.  · You gain 5 pounds or more in 1 week (even if you do not have swelling of your hands, face, legs, or feet).  · You get exposed to German measles and have never had them.  · You are exposed to fifth disease or chickenpox.  · You develop belly (abdominal) pain. Round ligament discomfort is a common non-cancerous (benign) cause of abdominal pain in pregnancy. Your caregiver still must evaluate this.  · You develop headache, fever, diarrhea, pain with urination, or shortness of breath.  · You fall or are in a car accident or have any kind of trauma.  · There is mental or physical violence in your home.  Document Released: 10/16/2001 Document Revised: 07/16/2012 Document Reviewed: 04/19/2009  Exit[employer]® Patient Information ©2014 [employer].

## 2014-04-09 LAB — PRESCRIPTION MONITORING PROFILE (19 PANEL)
Amphetamine/Meth: NEGATIVE ng/mL
BARBITURATE SCREEN, URINE: NEGATIVE ng/mL
Benzodiazepine Screen, Urine: NEGATIVE ng/mL
Buprenorphine, Urine: NEGATIVE ng/mL
CARISOPRODOL, URINE: NEGATIVE ng/mL
CREATININE, URINE: 47.48 mg/dL (ref >20.0)
Cannabinoid Scrn, Ur: NEGATIVE ng/mL
Cocaine Metabolites: NEGATIVE ng/mL
ECSTASY: NEGATIVE ng/mL
Fentanyl, Ur: NEGATIVE ng/mL
MEPERIDINE UR: NEGATIVE ng/mL
Methadone Screen, Urine: NEGATIVE ng/mL
Methaqualone: NEGATIVE ng/mL
NITRITES URINE, INITIAL: NEGATIVE ug/mL
OXYCODONE SCRN UR: NEGATIVE ng/mL
Opiate Screen, Urine: NEGATIVE ng/mL
PH URINE, INITIAL: 7.9 pH (ref 4.5–8.9)
PROPOXYPHENE: NEGATIVE ng/mL
Phencyclidine, Ur: NEGATIVE ng/mL
TRAMADOL UR: NEGATIVE ng/mL
Tapentadol, urine: NEGATIVE ng/mL
Zolpidem, Urine: NEGATIVE ng/mL

## 2014-04-09 LAB — CULTURE, OB URINE: Colony Count: >=100000

## 2014-04-09 LAB — CYTOLOGY - PAP

## 2014-04-12 ENCOUNTER — Encounter (HOSPITAL_COMMUNITY): Payer: Self-pay

## 2014-04-12 ENCOUNTER — Ambulatory Visit (HOSPITAL_COMMUNITY)
Admission: RE | Admit: 2014-04-12 | Discharge: 2014-04-12 | Disposition: A | Discharge status: Home / Self Care | Payer: Medicaid Other | Source: Ambulatory Visit | Attending: Advanced Practice Midwife | Admitting: Advanced Practice Midwife

## 2014-04-12 DIAGNOSIS — Z36 Encounter for antenatal screening of mother: Secondary | ICD-10-CM | POA: Insufficient documentation

## 2014-04-12 DIAGNOSIS — Z3682 Encounter for antenatal screening for nuchal translucency: Secondary | ICD-10-CM

## 2014-04-15 ENCOUNTER — Other Ambulatory Visit: Payer: Self-pay

## 2014-05-19 ENCOUNTER — Ambulatory Visit (INDEPENDENT_AMBULATORY_CARE_PROVIDER_SITE_OTHER): Payer: Medicaid Other | Admitting: Obstetrics and Gynecology

## 2014-05-19 ENCOUNTER — Encounter: Payer: Self-pay | Admitting: Obstetrics and Gynecology

## 2014-05-19 VITALS — BP 104/60 | HR 61 | Temp 98.9°F | Wt 164.8 lb

## 2014-05-19 DIAGNOSIS — Z3402 Encounter for supervision of normal first pregnancy, second trimester: Secondary | ICD-10-CM

## 2014-05-19 DIAGNOSIS — Z3400 Encounter for supervision of normal first pregnancy, unspecified trimester: Secondary | ICD-10-CM

## 2014-05-19 DIAGNOSIS — A184 Tuberculosis of skin and subcutaneous tissue: Secondary | ICD-10-CM

## 2014-05-19 DIAGNOSIS — Z3201 Encounter for pregnancy test, result positive: Secondary | ICD-10-CM

## 2014-05-19 DIAGNOSIS — IMO0001 Reserved for inherently not codable concepts without codable children: Secondary | ICD-10-CM

## 2014-05-19 LAB — POCT URINALYSIS DIP (DEVICE)
BILIRUBIN URINE: NEGATIVE
Glucose, UA: NEGATIVE mg/dL
Hgb urine dipstick: NEGATIVE
KETONES UR: NEGATIVE mg/dL
LEUKOCYTES UA: NEGATIVE
Nitrite: NEGATIVE
Protein, ur: NEGATIVE mg/dL
Specific Gravity, Urine: 1.015 (ref 1.005–1.030)
Urobilinogen, UA: 0.2 mg/dL (ref 0.0–1.0)
pH: 7.5 (ref 5.0–8.0)

## 2014-05-19 NOTE — Progress Notes (Signed)
Doing well. Working. MSAFP done. Reviewed dating criteria and lab results. Recommended high iron foods. Scheduled anatomic US for 19-20 wks. .Marland Kitchen

## 2014-05-19 NOTE — Patient Instructions (Addendum)
Second Trimester of Pregnancy °The second trimester is from week 13 through week 28, months 4 through 6. The second trimester is often a time when you feel your best. Your body has also adjusted to being pregnant, and you begin to feel better physically. Usually, morning sickness has lessened or quit completely, you may have more energy, and you may have an increase in appetite. The second trimester is also a time when the fetus is growing rapidly. At the end of the sixth month, the fetus is about 9 inches long and weighs about 1½ pounds. You will likely begin to feel the baby move (quickening) between 18 and 20 weeks of the pregnancy. °BODY CHANGES °Your body goes through many changes during pregnancy. The changes vary from woman to woman.  °· Your weight will continue to increase. You will notice your lower abdomen bulging out. °· You may begin to get stretch marks on your hips, abdomen, and breasts. °· You may develop headaches that can be relieved by medicines approved by your health care provider. °· You may urinate more often because the fetus is pressing on your bladder. °· You may develop or continue to have heartburn as a result of your pregnancy. °· You may develop constipation because certain hormones are causing the muscles that push waste through your intestines to slow down. °· You may develop hemorrhoids or swollen, bulging veins (varicose veins). °· You may have back pain because of the weight gain and pregnancy hormones relaxing your joints between the bones in your pelvis and as a result of a shift in weight and the muscles that support your balance. °· Your breasts will continue to grow and be tender. °· Your gums may bleed and may be sensitive to brushing and flossing. °· Dark spots or blotches (chloasma, mask of pregnancy) may develop on your face. This will likely fade after the baby is born. °· A dark line from your belly button to the pubic area (linea nigra) may appear. This will likely fade  after the baby is born. °· You may have changes in your hair. These can include thickening of your hair, rapid growth, and changes in texture. Some women also have hair loss during or after pregnancy, or hair that feels dry or thin. Your hair will most likely return to normal after your baby is born. °WHAT TO EXPECT AT YOUR PRENATAL VISITS °During a routine prenatal visit: °· You will be weighed to make sure you and the fetus are growing normally. °· Your blood pressure will be taken. °· Your abdomen will be measured to track your baby's growth. °· The fetal heartbeat will be listened to. °· Any test results from the previous visit will be discussed. °Your health care provider may ask you: °· How you are feeling. °· If you are feeling the baby move. °· If you have had any abnormal symptoms, such as leaking fluid, bleeding, severe headaches, or abdominal cramping. °· If you have any questions. °Other tests that may be performed during your second trimester include: °· Blood tests that check for: °¨ Low iron levels (anemia). °¨ Gestational diabetes (between 24 and 28 weeks). °¨ Rh antibodies. °· Urine tests to check for infections, diabetes, or protein in the urine. °· An ultrasound to confirm the proper growth and development of the baby. °· An amniocentesis to check for possible genetic problems. °· Fetal screens for spina bifida and Down syndrome. °HOME CARE INSTRUCTIONS  °· Avoid all smoking, herbs, alcohol, and unprescribed   drugs. These chemicals affect the formation and growth of the baby. °· Follow your health care provider's instructions regarding medicine use. There are medicines that are either safe or unsafe to take during pregnancy. °· Exercise only as directed by your health care provider. Experiencing uterine cramps is a good sign to stop exercising. °· Continue to eat regular, healthy meals. °· Wear a good support bra for breast tenderness. °· Do not use hot tubs, steam rooms, or saunas. °· Wear your  seat belt at all times when driving. °· Avoid raw meat, uncooked cheese, cat litter boxes, and soil used by cats. These carry germs that can cause birth defects in the baby. °· Take your prenatal vitamins. °· Try taking a stool softener (if your health care provider approves) if you develop constipation. Eat more high-fiber foods, such as fresh vegetables or fruit and whole grains. Drink plenty of fluids to keep your urine clear or pale yellow. °· Take warm sitz baths to soothe any pain or discomfort caused by hemorrhoids. Use hemorrhoid cream if your health care provider approves. °· If you develop varicose veins, wear support hose. Elevate your feet for 15 minutes, 3-4 times a day. Limit salt in your diet. °· Avoid heavy lifting, wear low heel shoes, and practice good posture. °· Rest with your legs elevated if you have leg cramps or low back pain. °· Visit your dentist if you have not gone yet during your pregnancy. Use a soft toothbrush to brush your teeth and be gentle when you floss. °· A sexual relationship may be continued unless your health care provider directs you otherwise. °· Continue to go to all your prenatal visits as directed by your health care provider. °SEEK MEDICAL CARE IF:  °· You have dizziness. °· You have mild pelvic cramps, pelvic pressure, or nagging pain in the abdominal area. °· You have persistent nausea, vomiting, or diarrhea. °· You have a bad smelling vaginal discharge. °· You have pain with urination. °SEEK IMMEDIATE MEDICAL CARE IF:  °· You have a fever. °· You are leaking fluid from your vagina. °· You have spotting or bleeding from your vagina. °· You have severe abdominal cramping or pain. °· You have rapid weight gain or loss. °· You have shortness of breath with chest pain. °· You notice sudden or extreme swelling of your face, hands, ankles, feet, or legs. °· You have not felt your baby move in over an hour. °· You have severe headaches that do not go away with  medicine. °· You have vision changes. °Document Released: 10/16/2001 Document Revised: 10/27/2013 Document Reviewed: 12/23/2012 °Exit[employer]® Patient Information ©2015 [employer]. This information is not intended to replace advice given to you by your health care provider. Make sure you discuss any questions you have with your health care provider. ° ° °Iron-Rich Diet °An iron-rich diet contains foods that are good sources of iron. Iron is an important mineral that helps your body produce hemoglobin. Hemoglobin is a protein in red blood cells that carries oxygen to the body's tissues. Sometimes, the iron level in your blood can be low. This may be caused by: °· A lack of iron in your diet. °· Blood loss. °· Times of growth, such as during pregnancy or during a child's growth and development. °Low levels of iron can cause a decrease in the number of red blood cells. This can result in iron deficiency anemia. Iron deficiency anemia symptoms include: °· Tiredness. °· Weakness. °· Irritability. °· Increased chance   of infection. °Here are some recommendations for daily iron intake: °· Males older than 26 years of age need 8 mg of iron per day. °· Women ages 19 to 50 need 18 mg of iron per day. °· Pregnant women need 27 mg of iron per day, and women who are over 19 years of age and breastfeeding need 9 mg of iron per day. °· Women over the age of 50 need 8 mg of iron per day. °SOURCES OF IRON °There are 2 types of iron that are found in food: heme iron and nonheme iron. Heme iron is absorbed by the body better than nonheme iron. Heme iron is found in meat, poultry, and fish. Nonheme iron is found in grains, beans, and vegetables. °Heme Iron Sources °Food / Iron (mg) °· Chicken liver, 3 oz (85 g)/ 10 mg °· Beef liver, 3 oz (85 g)/ 5.5 mg °· Oysters, 3 oz (85 g)/ 8 mg °· Beef, 3 oz (85 g)/ 2 to 3 mg °· Shrimp, 3 oz (85 g)/ 2.8 mg °· Turkey, 3 oz (85 g)/ 2 mg °· Chicken, 3 oz (85 g) / 1 mg °· Fish (tuna, halibut), 3 oz (85  g)/ 1 mg °· Pork, 3 oz (85 g)/ 0.9 mg °Nonheme Iron Sources °Food / Iron (mg) °· Ready-to-eat breakfast cereal, iron-fortified / 3.9 to 7 mg °· Tofu, ½ cup / 3.4 mg °· Kidney beans, ½ cup / 2.6 mg °· Baked potato with skin / 2.7 mg °· Asparagus, ½ cup / 2.2 mg °· Avocado / 2 mg °· Dried peaches, ½ cup / 1.6 mg °· Raisins, ½ cup / 1.5 mg °· Soy milk, 1 cup / 1.5 mg °· Whole-wheat bread, 1 slice / 1.2 mg °· Spinach, 1 cup / 0.8 mg °· Broccoli, ½ cup / 0.6 mg °IRON ABSORPTION °Certain foods can decrease the body's absorption of iron. Try to avoid these foods and beverages while eating meals with iron-containing foods: °· Coffee. °· Tea. °· Fiber. °· Soy. °Foods containing vitamin C can help increase the amount of iron your body absorbs from iron sources, especially from nonheme sources. Eat foods with vitamin C along with iron-containing foods to increase your iron absorption. Foods that are high in vitamin C include many fruits and vegetables. Some good sources are: °· Fresh orange juice. °· Oranges. °· Strawberries. °· Mangoes. °· Grapefruit. °· Red bell peppers. °· Green bell peppers. °· Broccoli. °· Potatoes with skin. °· Tomato juice. °Document Released: 06/05/2005 Document Revised: 01/14/2012 Document Reviewed: 04/12/2011 °Exit[employer]® Patient Information ©2015 [employer]. This information is not intended to replace advice given to you by your health care provider. Make sure you discuss any questions you have with your health care provider. ° °

## 2014-05-20 LAB — ALPHA FETOPROTEIN, MATERNAL
AFP: 51.7 IU/mL
Curr Gest Age: 17.4 wks.days
MOM FOR AFP: 1.51
Open Spina bifida: NEGATIVE
Osb Risk: 1:2690 {titer}

## 2014-05-21 ENCOUNTER — Encounter: Payer: Self-pay | Admitting: *Deleted

## 2014-05-21 NOTE — Progress Notes (Signed)
PPD NEGATIVE: letter given.

## 2014-05-27 ENCOUNTER — Telehealth: Payer: Self-pay

## 2014-05-27 NOTE — Telephone Encounter (Signed)
Message copied by Louanna RawAMPBELL, Ryna Beckstrom M on Thu May 27, 2014  8:43 AM ------      Message from: Aviva SignsWILLIAMS, MARIE L      Created: Wed May 26, 2014 12:15 PM      Regarding: Needs New OB labs drawn       Never got blood drawn when I did her New OB in June            Saw Dierdre next but did Quad only and didn't get new ob labs done            Next visit is not until August            Can she come in for a lab visit only?            Thanks      Hilda LiasMarie ------

## 2014-05-27 NOTE — Telephone Encounter (Signed)
Attempted to call patient on both numbers-- no answer. Left messages at both numbers stating we are trying to reach you regarding a lab appointment, please call clinic at earliest convenience.

## 2014-05-28 NOTE — Telephone Encounter (Signed)
Called patient at both numbers, no answer- left message that we are trying to reach you about coming in for an appt, please give us call back at the clinics

## 2014-05-28 NOTE — Telephone Encounter (Signed)
Pt called the front desk and Rachel Atkinson scheduled lab appt for Monday 05/31/14 @ 1500.

## 2014-05-31 ENCOUNTER — Other Ambulatory Visit: Payer: Medicaid Other

## 2014-05-31 DIAGNOSIS — R109 Unspecified abdominal pain: Principal | ICD-10-CM

## 2014-05-31 DIAGNOSIS — O26899 Other specified pregnancy related conditions, unspecified trimester: Secondary | ICD-10-CM

## 2014-06-01 LAB — OBSTETRIC PANEL
Antibody Screen: NEGATIVE
Basophils Absolute: 0 10*3/uL (ref 0.0–0.1)
Basophils Relative: 0 % (ref 0–1)
Eosinophils Absolute: 0.1 10*3/uL (ref 0.0–0.7)
Eosinophils Relative: 1 % (ref 0–5)
HEMATOCRIT: 30.8 % — AB (ref 36.0–46.0)
HEMOGLOBIN: 11 g/dL — AB (ref 12.0–15.0)
Hepatitis B Surface Ag: NEGATIVE
LYMPHS ABS: 1.2 10*3/uL (ref 0.7–4.0)
Lymphocytes Relative: 20 % (ref 12–46)
MCH: 32.4 pg (ref 26.0–34.0)
MCHC: 35.7 g/dL (ref 30.0–36.0)
MCV: 90.6 fL (ref 78.0–100.0)
MONOS PCT: 11 % (ref 3–12)
Monocytes Absolute: 0.6 10*3/uL (ref 0.1–1.0)
NEUTROS ABS: 3.9 10*3/uL (ref 1.7–7.7)
Neutrophils Relative %: 68 % (ref 43–77)
Platelets: 281 10*3/uL (ref 150–400)
RBC: 3.4 MIL/uL — ABNORMAL LOW (ref 3.87–5.11)
RDW: 13.4 % (ref 11.5–15.5)
RUBELLA: 7.01 {index} — AB (ref <0.90)
Rh Type: POSITIVE
WBC: 5.8 10*3/uL (ref 4.0–10.5)

## 2014-06-01 LAB — HIV ANTIBODY (ROUTINE TESTING W REFLEX): HIV 1&2 Ab, 4th Generation: NONREACTIVE

## 2014-06-02 LAB — HEMOGLOBINOPATHY EVALUATION
HGB F QUANT: 1 % (ref 0.0–2.0)
Hemoglobin Other: 0 %
Hgb A2 Quant: 2.9 % (ref 2.2–3.2)
Hgb A: 96.1 % — ABNORMAL LOW (ref 96.8–97.8)
Hgb S Quant: 0 %

## 2014-06-04 ENCOUNTER — Ambulatory Visit (HOSPITAL_COMMUNITY)
Admission: RE | Admit: 2014-06-04 | Discharge: 2014-06-04 | Disposition: A | Discharge status: Home / Self Care | Payer: Medicaid Other | Source: Ambulatory Visit | Attending: Obstetrics and Gynecology | Admitting: Obstetrics and Gynecology

## 2014-06-04 ENCOUNTER — Other Ambulatory Visit: Payer: Self-pay | Admitting: Obstetrics and Gynecology

## 2014-06-04 DIAGNOSIS — Z3689 Encounter for other specified antenatal screening: Secondary | ICD-10-CM | POA: Insufficient documentation

## 2014-06-04 DIAGNOSIS — R109 Unspecified abdominal pain: Secondary | ICD-10-CM

## 2014-06-04 DIAGNOSIS — O26899 Other specified pregnancy related conditions, unspecified trimester: Secondary | ICD-10-CM

## 2014-06-04 DIAGNOSIS — Z3402 Encounter for supervision of normal first pregnancy, second trimester: Secondary | ICD-10-CM

## 2014-06-16 ENCOUNTER — Encounter: Payer: Medicaid Other | Admitting: Advanced Practice Midwife

## 2014-08-14 IMAGING — US US OB TRANSVAGINAL
1 series · 13 of 28 positions shown · non-contrast
Comparison: No priors.

CLINICAL DATA: Pelvic cramping and spotting. Early pregnancy. Beta
HCG of 117.

EXAM:
OBSTETRIC <14 WK US AND TRANSVAGINAL OB US
TECHNIQUE: Both transabdominal and transvaginal ultrasound examinations were
performed for complete evaluation of the gestation as well as the
maternal uterus, adnexal regions, and pelvic cul-de-sac.
Transvaginal technique was performed to assess early pregnancy.

[Series 1: us ob comp less 14 wks · 42 acquisitions, 13 frames shown]
[im 2/42]
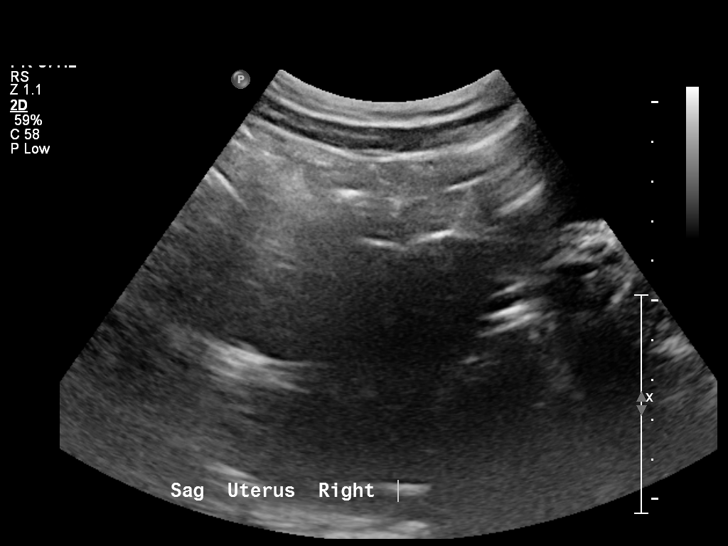
[im 5/42]
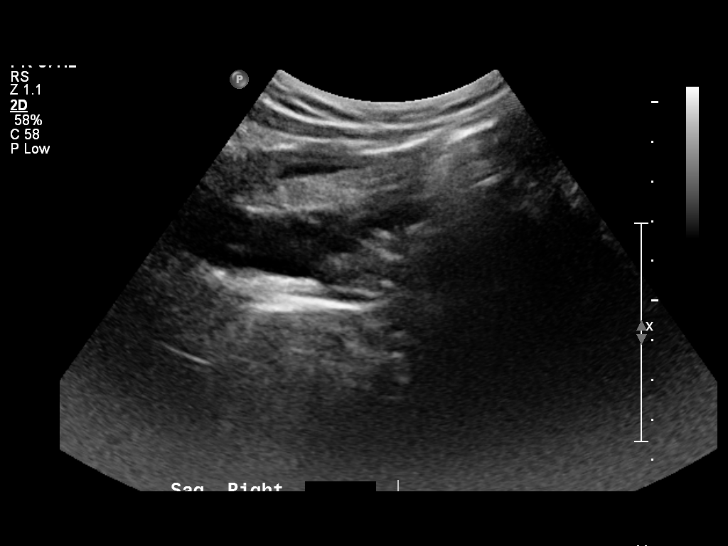
[im 8/42]
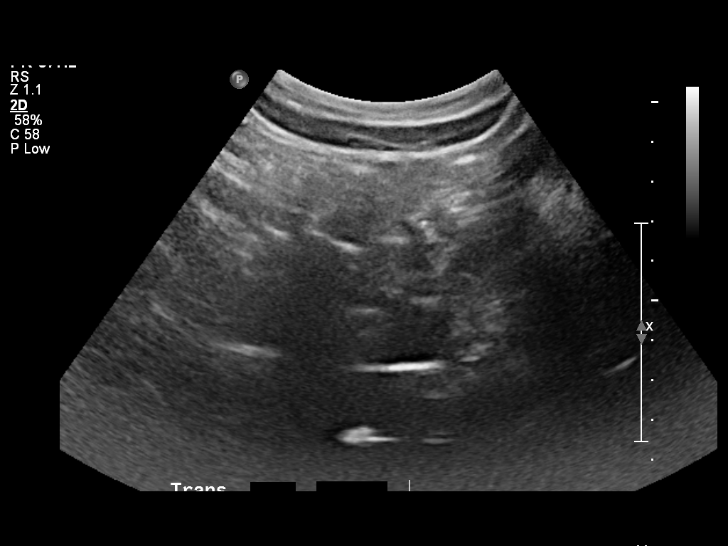
[im 11/42]
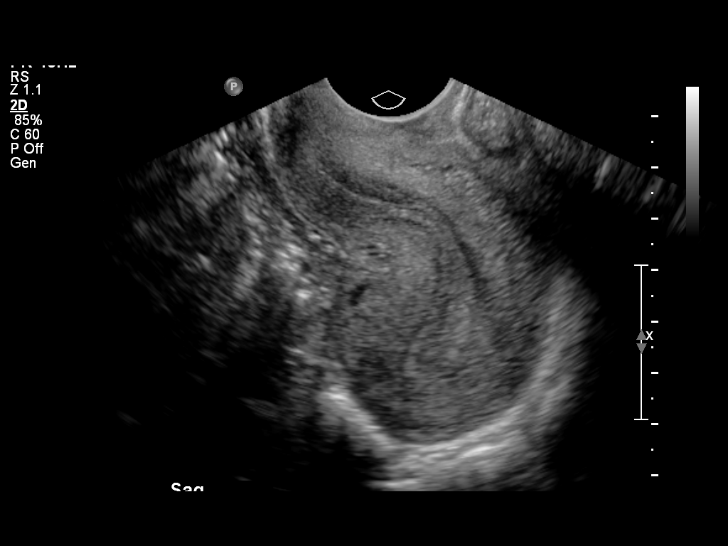
[im 14/42]
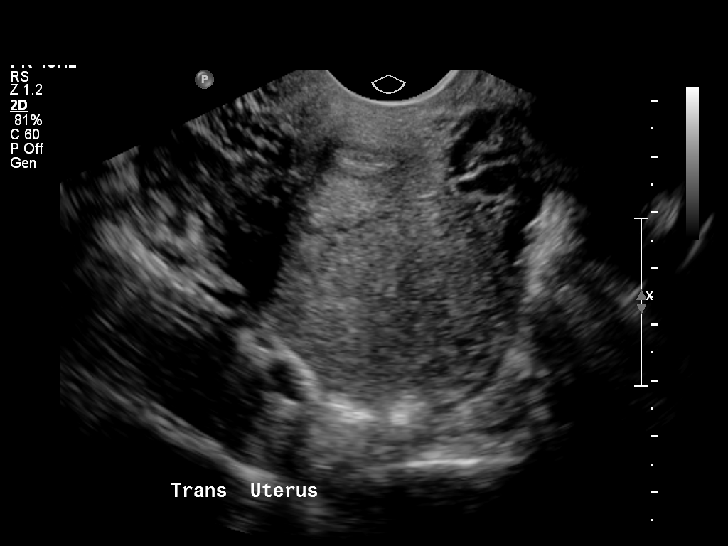
[im 17/42]
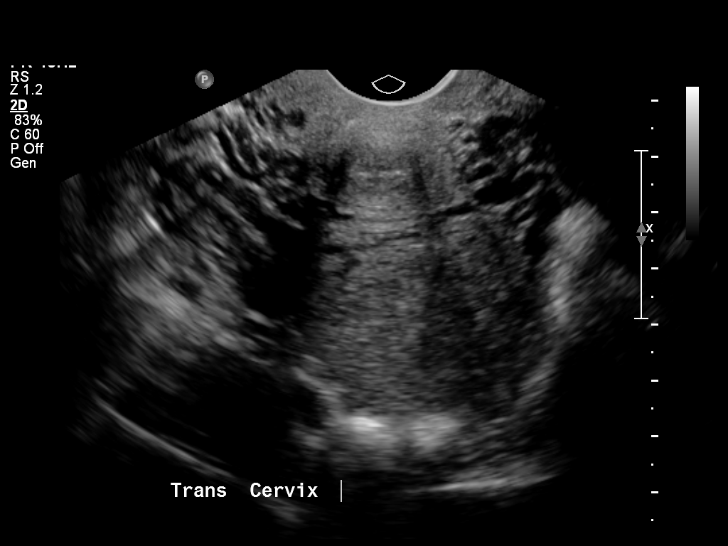
[im 22/42]
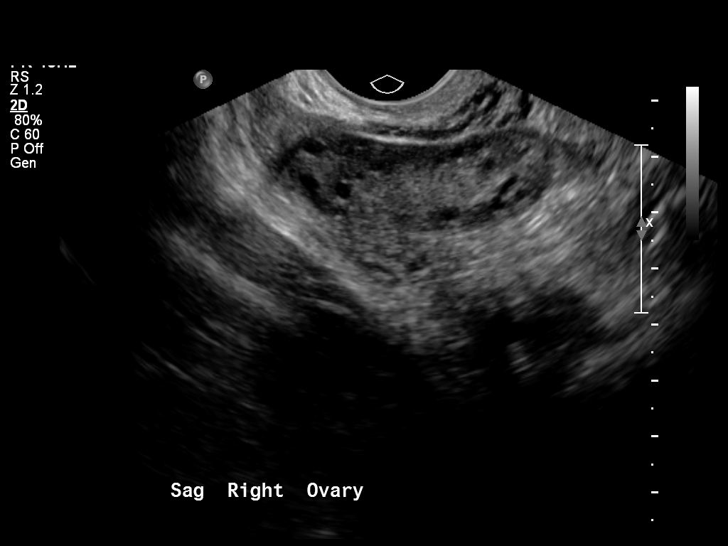
[im 25/42]
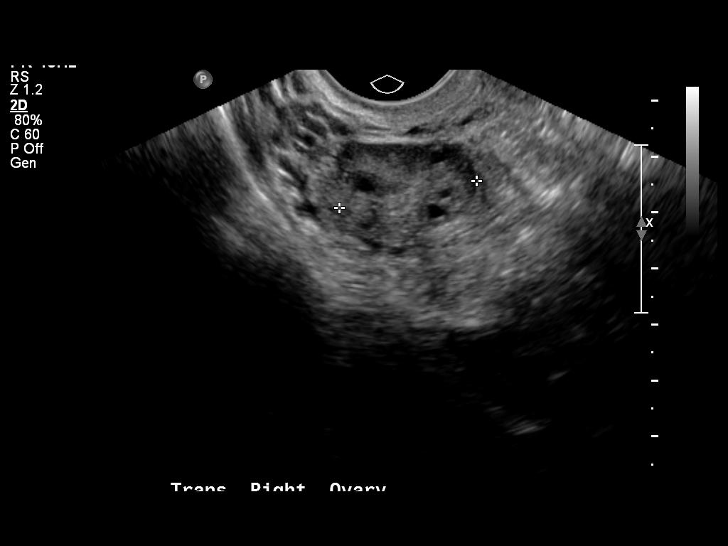
[im 28/42]
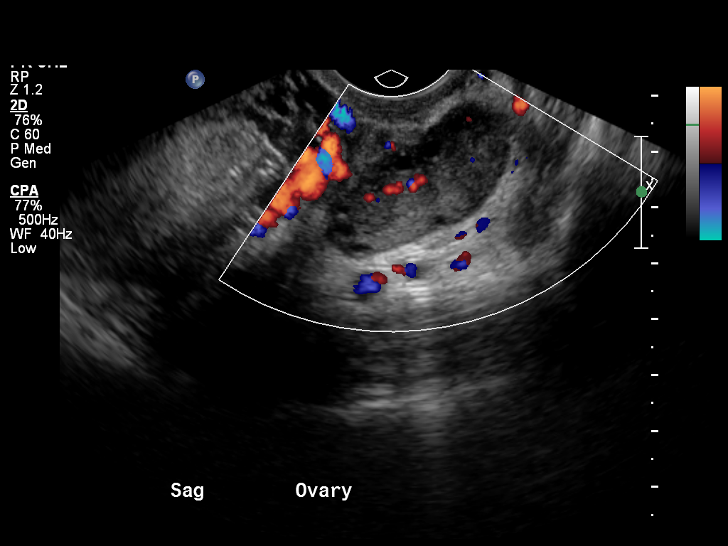
[im 31/42]
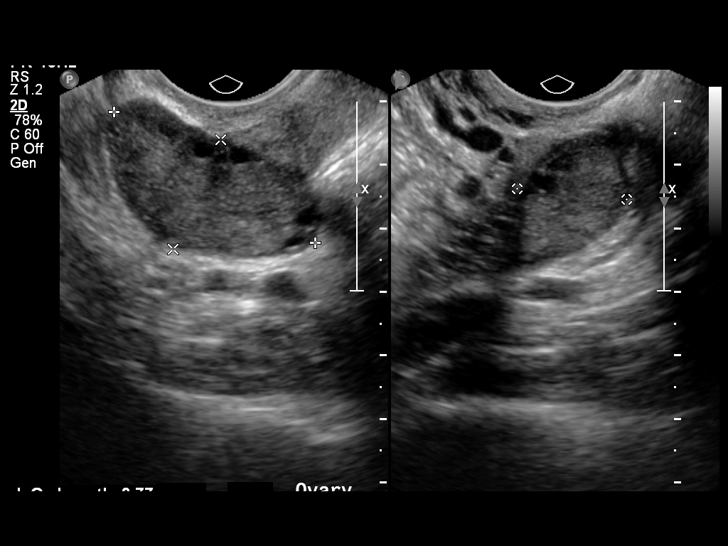
[im 34/42]
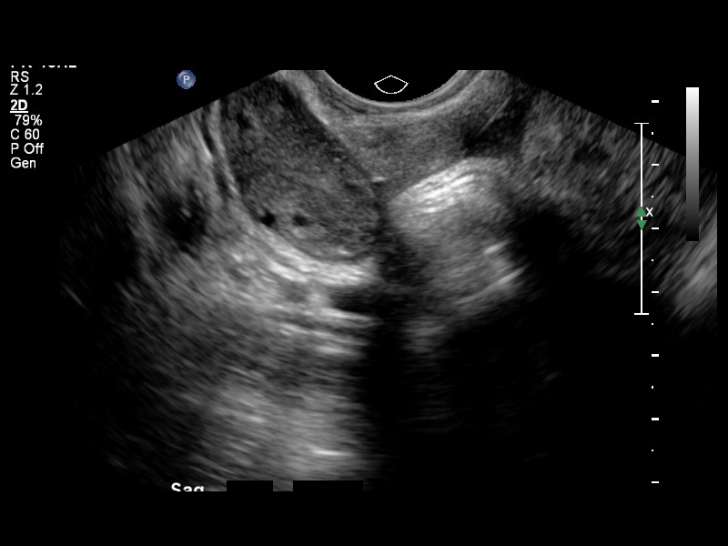
[im 37/42]
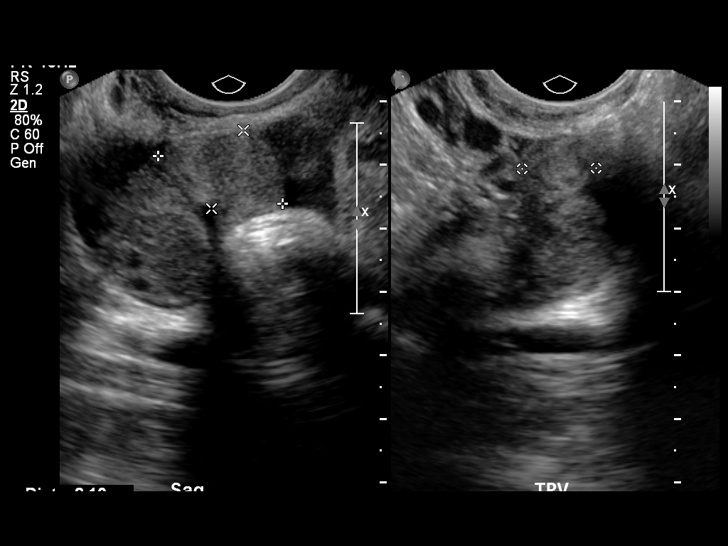
[im 40/42]
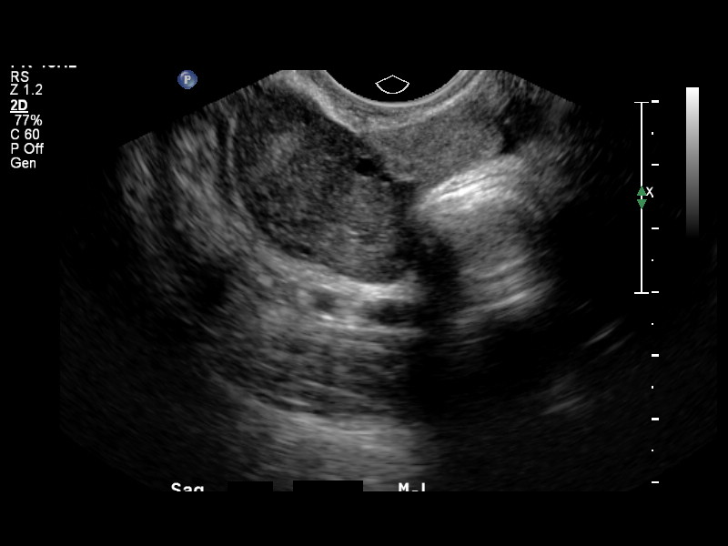

[13 of 28 positions shown; findings below may reference images not displayed]

FINDINGS: Intrauterine gestational sac: None.

Yolk sac:  None.

Embryo:  None.

Cardiac Activity: None.

Maternal uterus/adnexae: Retroverted uterus. Bilateral ovaries are
normal in appearance with multiple normal follicles. Trace amount of
free fluid adjacent to the left ovary. Immediately adjacent to the
with left ovary there is also a small isoechoic mass measuring
approximately 2.1 x 1.3 x 1.6 cm, which has some internal
vascularity. Real-time imaging of this lesion was observed with
direct pressure applied to the lesion with the probe, and the lesion
was quite flexible; per report from the sonographer, the patient was
not significantly tender over this lesion.
IMPRESSION: 1. No viable IUP identified at this time.
2. Trace amount of free fluid adjacent to the left ovary. In
addition, there is a small soft tissue lesion in the left adnexal
region immediately adjacent to the left ovary which appears slightly
hypervascular. This is nonspecific, but the possibility of an
ectopic pregnancy in this region is difficult to entirely exclude.
Clinical correlation is recommended, with consideration for repeat
pelvic ultrasound in the near future should the patient's symptoms
worsen.

## 2014-09-06 ENCOUNTER — Encounter: Payer: Self-pay | Admitting: Obstetrics and Gynecology

## 2014-09-29 IMAGING — US US OB TRANSVAGINAL
1 series · 13 of 28 positions shown · non-contrast
Comparison: 01/09/2014.

CLINICAL DATA: Pelvic pain.

EXAM:
TRANSVAGINAL OB ULTRASOUND
TECHNIQUE: Transvaginal ultrasound was performed for complete evaluation of the
gestation as well as the maternal uterus, adnexal regions, and
pelvic cul-de-sac.

[Series 1: us ob transvaginal · 13 of 36 slices shown]
[im 2/36]
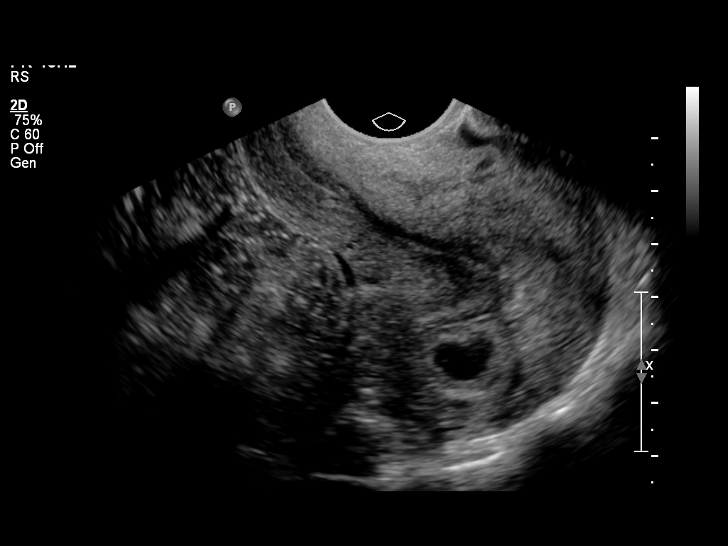
[im 4/36]
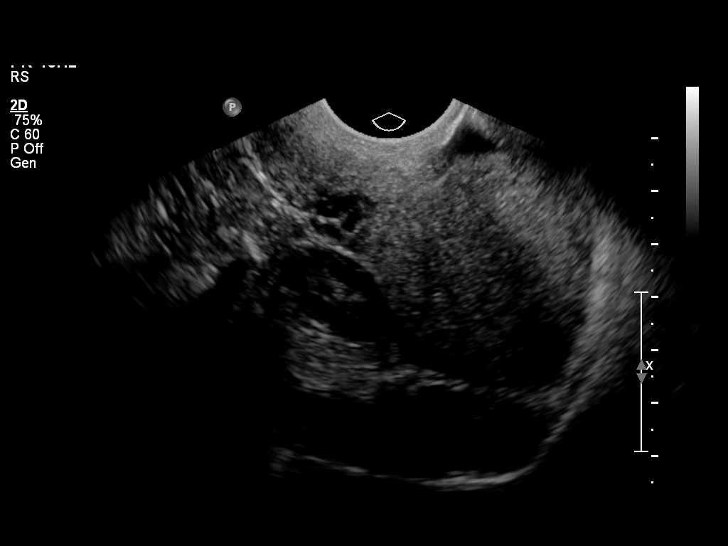
[im 7/36]
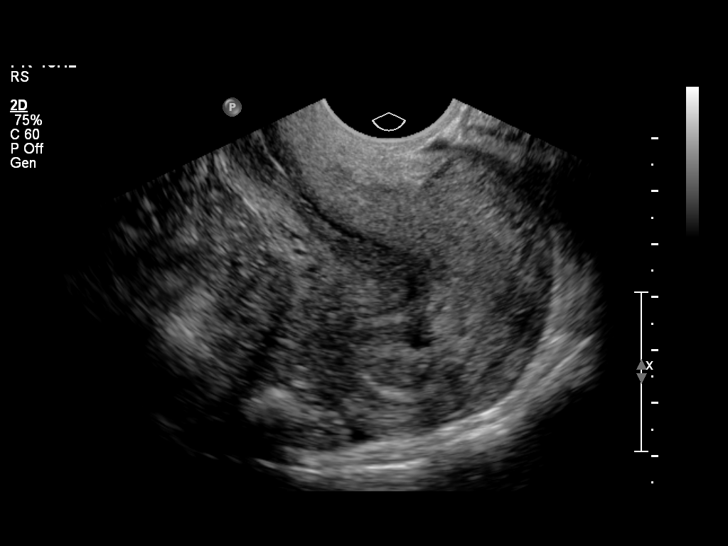
[im 10/36]
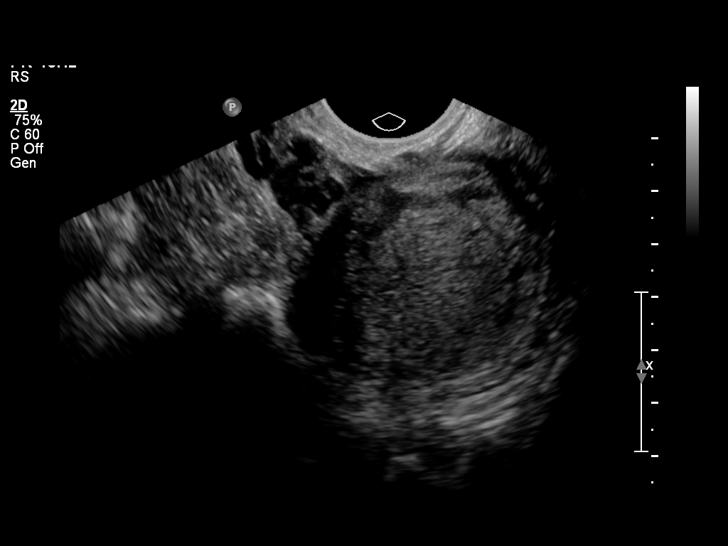
[im 12/36]
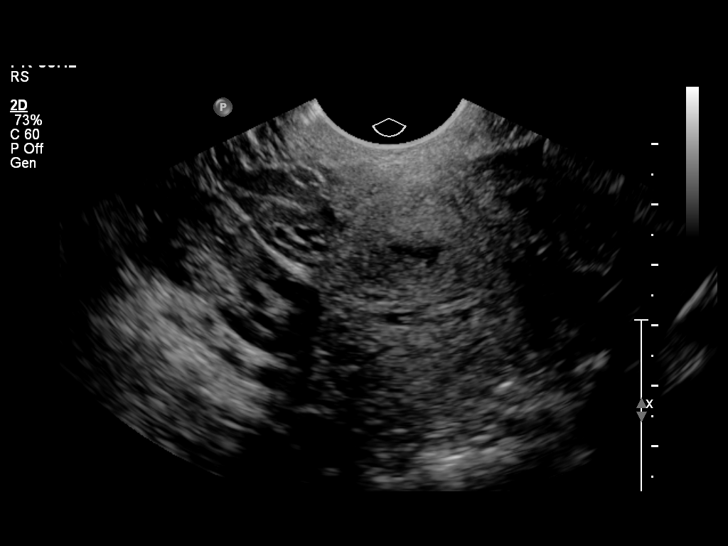
[im 15/36]
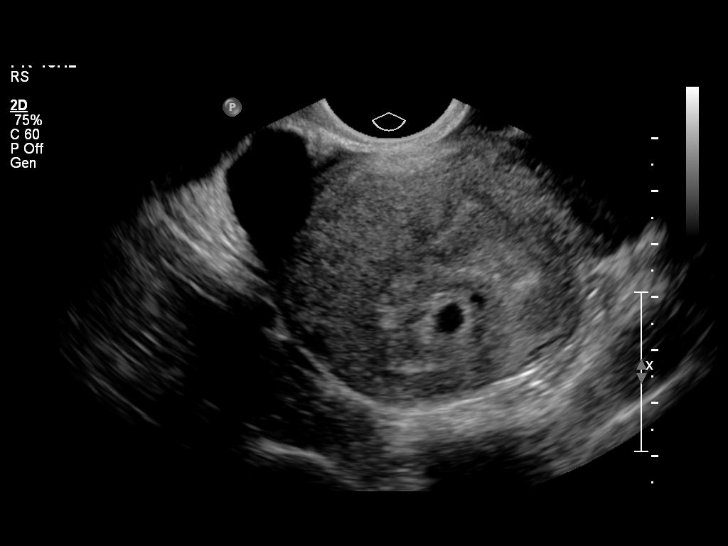
[im 19/36]
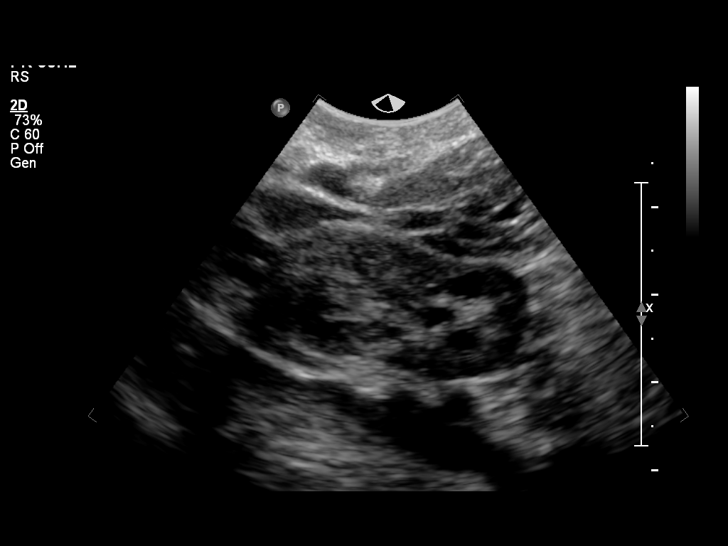
[im 21/36]
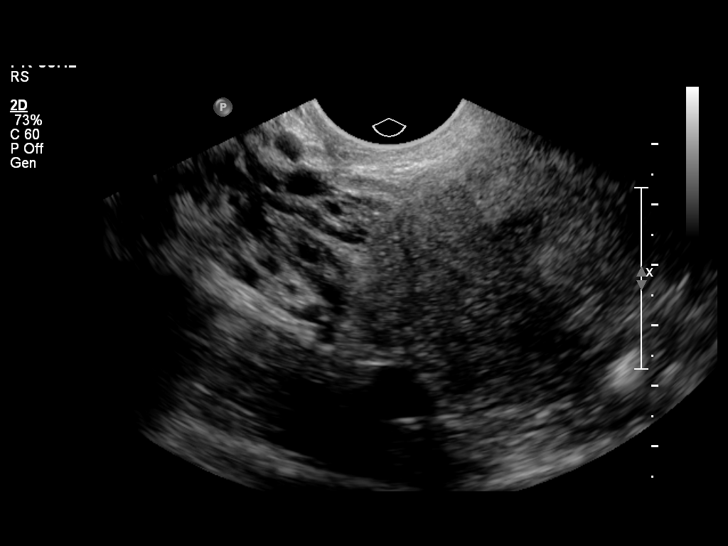
[im 24/36]
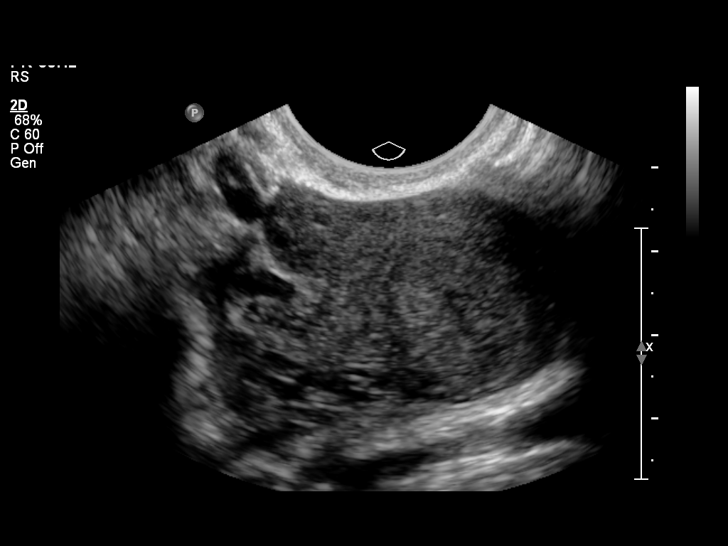
[im 26/36]
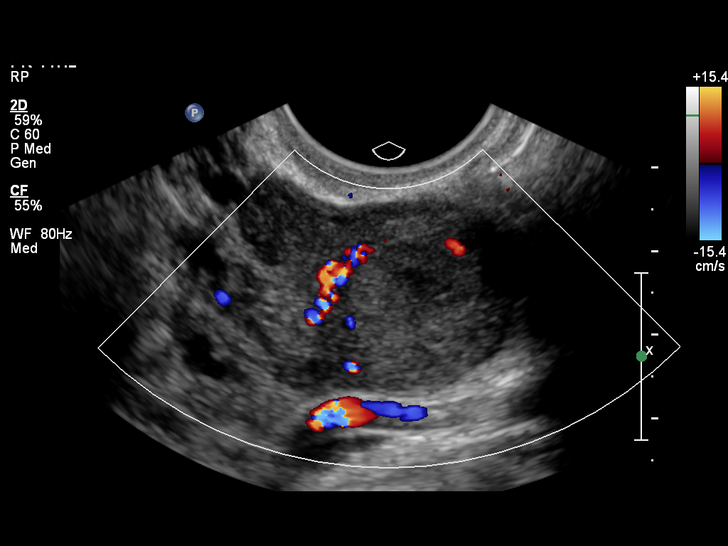
[im 29/36]
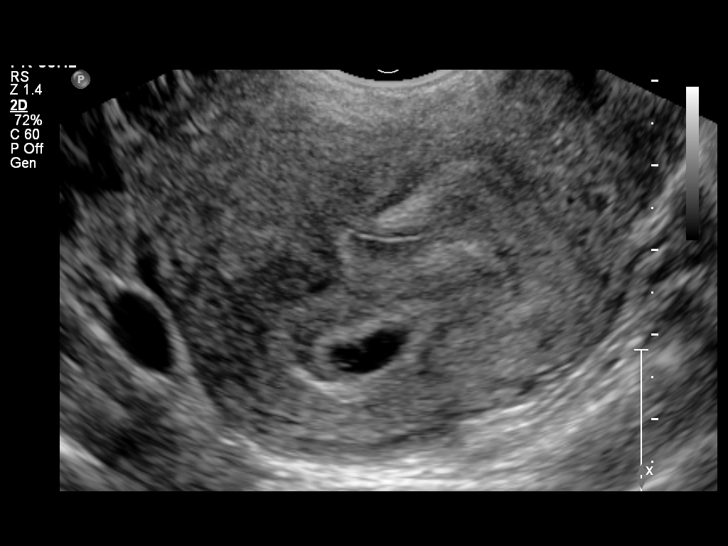
[im 32/36]
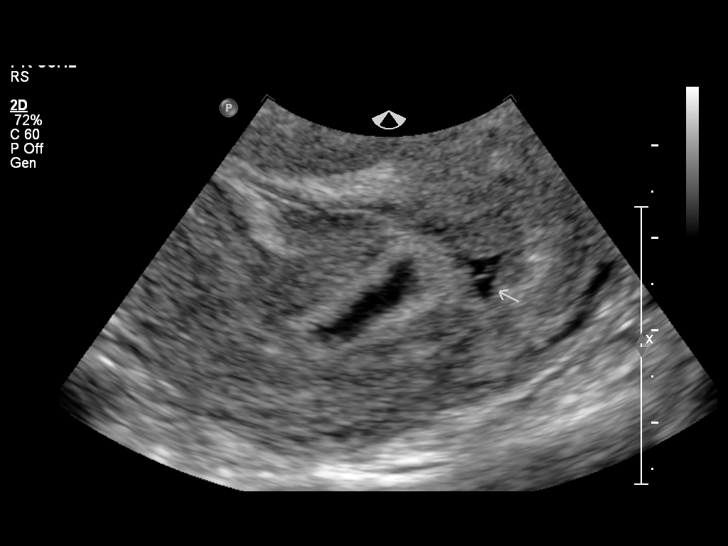
[im 34/36]
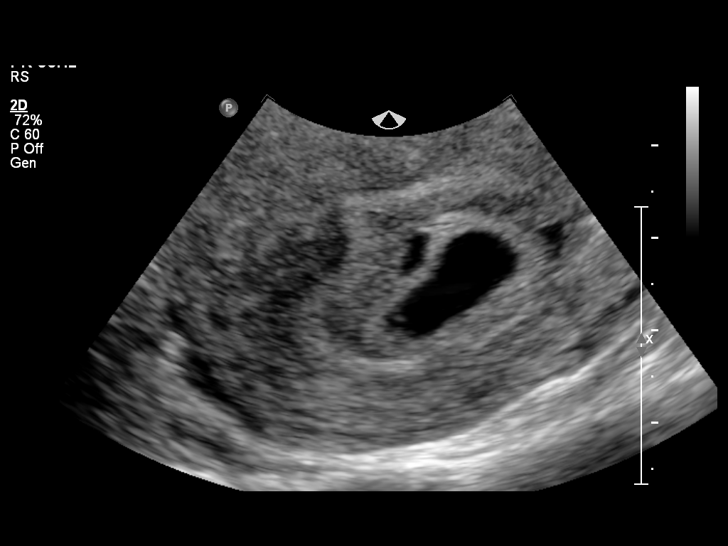

[13 of 28 positions shown; findings below may reference images not displayed]

FINDINGS: Intrauterine gestational sac: Present.

Yolk sac:  Present.

Embryo:  None.

Cardiac Activity: None.

Heart Rate: N/A

MSD: 9.3  mm   5 w   4 d     US EDC: 10/23/2014.

Maternal uterus/adnexae: Adjacent to the gestational sac described
above there is a tiny round fluid collection within the endometrial
canal measuring approximately 5 x 5 x 5 mm. In addition, there is a
small subchorionic hemorrhage. The appearance of the ovaries is
normal bilaterally (probable degenerating corpus luteum cyst in the
left ovary incidentally noted). Trace volume of free fluid in the
cul-de-sac.
IMPRESSION: 1. Single IUP identified. Estimated gestational age is 5 weeks and 4
days. There is a small subchorionic hemorrhage. In addition, there
is a 5 mm fluid collection adjacent to the gestational sac, which
could represent a second gestational sac, although this is
nonspecific at this time. Attention on followup studies is
suggested.
2. Trace volume of free fluid in the cul-de-sac.

## 2014-10-17 ENCOUNTER — Encounter (HOSPITAL_COMMUNITY): Payer: Self-pay | Admitting: *Deleted

## 2014-10-17 ENCOUNTER — Inpatient Hospital Stay (HOSPITAL_COMMUNITY)
Admission: AD | Admit: 2014-10-17 | Discharge: 2014-10-17 | Disposition: A | Discharge status: Home / Self Care | Payer: Medicaid Other | Source: Ambulatory Visit | Attending: Obstetrics and Gynecology | Admitting: Obstetrics and Gynecology

## 2014-10-17 DIAGNOSIS — O471 False labor at or after 37 completed weeks of gestation: Secondary | ICD-10-CM | POA: Diagnosis present

## 2014-10-17 DIAGNOSIS — Z3A39 39 weeks gestation of pregnancy: Secondary | ICD-10-CM | POA: Diagnosis not present

## 2014-10-17 NOTE — Discharge Instructions (Signed)
Braxton Hicks Contractions °Contractions of the uterus can occur throughout pregnancy. Contractions are not always a sign that you are in labor.  °WHAT ARE BRAXTON HICKS CONTRACTIONS?  °Contractions that occur before labor are called Braxton Hicks contractions, or false labor. Toward the end of pregnancy (32-34 weeks), these contractions can develop more often and may become more forceful. This is not true labor because these contractions do not result in opening (dilatation) and thinning of the cervix. They are sometimes difficult to tell apart from true labor because these contractions can be forceful and people have different pain tolerances. You should not feel embarrassed if you go to the hospital with false labor. Sometimes, the only way to tell if you are in true labor is for your health care provider to look for changes in the cervix. °If there are no prenatal problems or other health problems associated with the pregnancy, it is completely safe to be sent home with false labor and await the onset of true labor. °HOW CAN YOU TELL THE DIFFERENCE BETWEEN TRUE AND FALSE LABOR? °False Labor °· The contractions of false labor are usually shorter and not as hard as those of true labor.   °· The contractions are usually irregular.   °· The contractions are often felt in the front of the lower abdomen and in the groin.   °· The contractions may go away when you walk around or change positions while lying down.   °· The contractions get weaker and are shorter lasting as time goes on.   °· The contractions do not usually become progressively stronger, regular, and closer together as with true labor.   °True Labor °· Contractions in true labor last 30-70 seconds, become very regular, usually become more intense, and increase in frequency.   °· The contractions do not go away with walking.   °· The discomfort is usually felt in the top of the uterus and spreads to the lower abdomen and low back.   °· True labor can be  determined by your health care provider with an exam. This will show that the cervix is dilating and getting thinner.   °WHAT TO REMEMBER °· Keep up with your usual exercises and follow other instructions given by your health care provider.   °· Take medicines as directed by your health care provider.   °· Keep your regular prenatal appointments.   °· Eat and drink lightly if you think you are going into labor.   °· If Braxton Hicks contractions are making you uncomfortable:   °¨ Change your position from lying down or resting to walking, or from walking to resting.   °¨ Sit and rest in a tub of warm water.   °¨ Drink 2-3 glasses of water. Dehydration may cause these contractions.   °¨ Do slow and deep breathing several times an hour.   °WHEN SHOULD I SEEK IMMEDIATE MEDICAL CARE? °Seek immediate medical care if: °· Your contractions become stronger, more regular, and closer together.   °· You have fluid leaking or gushing from your vagina.   °· You have a fever.   °· You pass blood-tinged mucus.   °· You have vaginal bleeding.   °· You have continuous abdominal pain.   °· You have low back pain that you never had before.   °· You feel your baby's head pushing down and causing pelvic pressure.   °· Your baby is not moving as much as it used to.   °Document Released: 10/22/2005 Document Revised: 10/27/2013 Document Reviewed: 08/03/2013 °[employer]® Patient Information ©2015 [employer]. This information is not intended to replace advice given to you by your health care   provider. Make sure you discuss any questions you have with your health care provider. ° °

## 2014-10-17 NOTE — MAU Note (Signed)
Pt states UCs starting Saturday morning getting worse around 2300.  Pt states no leaking of fluid, but did see bloody show.

## 2014-10-18 ENCOUNTER — Encounter (HOSPITAL_COMMUNITY): Payer: Self-pay | Admitting: *Deleted

## 2014-10-18 ENCOUNTER — Inpatient Hospital Stay (HOSPITAL_COMMUNITY)
Admission: AD | Admit: 2014-10-18 | Discharge: 2014-10-20 | DRG: 775 | Disposition: A | Discharge status: Home / Self Care | Payer: Medicaid Other | Source: Ambulatory Visit | Attending: Obstetrics and Gynecology | Admitting: Obstetrics and Gynecology

## 2014-10-18 ENCOUNTER — Inpatient Hospital Stay (HOSPITAL_COMMUNITY): Payer: Medicaid Other | Admitting: Anesthesiology

## 2014-10-18 DIAGNOSIS — Z3A39 39 weeks gestation of pregnancy: Secondary | ICD-10-CM | POA: Diagnosis present

## 2014-10-18 DIAGNOSIS — Z3483 Encounter for supervision of other normal pregnancy, third trimester: Secondary | ICD-10-CM | POA: Diagnosis present

## 2014-10-18 HISTORY — DX: Other specified postprocedural states: Z98.890

## 2014-10-18 LAB — CBC
HEMATOCRIT: 35.8 % — AB (ref 36.0–46.0)
HEMOGLOBIN: 12.2 g/dL (ref 12.0–15.0)
MCH: 32.6 pg (ref 26.0–34.0)
MCHC: 34.1 g/dL (ref 30.0–36.0)
MCV: 95.7 fL (ref 78.0–100.0)
Platelets: 238 10*3/uL (ref 150–400)
RBC: 3.74 MIL/uL — ABNORMAL LOW (ref 3.87–5.11)
RDW: 13 % (ref 11.5–15.5)
WBC: 7 10*3/uL (ref 4.0–10.5)

## 2014-10-18 LAB — RPR

## 2014-10-18 MED ORDER — DIPHENHYDRAMINE HCL 50 MG/ML IJ SOLN: 12.5000 mg | INTRAMUSCULAR | Status: DC | PRN

## 2014-10-18 MED ORDER — OXYCODONE-ACETAMINOPHEN 5-325 MG PO TABS: 2.0000 | ORAL_TABLET | ORAL | Status: DC | PRN

## 2014-10-18 MED ORDER — PHENYLEPHRINE 40 MCG/ML (10ML) SYRINGE FOR IV PUSH (FOR BLOOD PRESSURE SUPPORT)
80.0000 ug | PREFILLED_SYRINGE | INTRAVENOUS | Status: DC | PRN
  Filled 2014-10-18: qty 2
  Filled 2014-10-18: qty 10

## 2014-10-18 MED ORDER — ACETAMINOPHEN 325 MG PO TABS: 650.0000 mg | ORAL_TABLET | ORAL | Status: DC | PRN

## 2014-10-18 MED ORDER — CITRIC ACID-SODIUM CITRATE 334-500 MG/5ML PO SOLN
30.0000 mL | ORAL | Status: DC | PRN
Start: 2014-10-18 — End: 2014-10-19

## 2014-10-18 MED ORDER — OXYTOCIN 40 UNITS IN LACTATED RINGERS INFUSION - SIMPLE MED
1.0000 m[IU]/min | INTRAVENOUS | Status: DC
  Administered 2014-10-18: 2 m[IU]/min via INTRAVENOUS
  Filled 2014-10-18: qty 1000

## 2014-10-18 MED ORDER — PHENYLEPHRINE 40 MCG/ML (10ML) SYRINGE FOR IV PUSH (FOR BLOOD PRESSURE SUPPORT)
80.0000 ug | PREFILLED_SYRINGE | INTRAVENOUS | Status: DC | PRN
  Filled 2014-10-18: qty 2

## 2014-10-18 MED ORDER — EPHEDRINE 5 MG/ML INJ
10.0000 mg | INTRAVENOUS | Status: DC | PRN
  Filled 2014-10-18: qty 2

## 2014-10-18 MED ORDER — LACTATED RINGERS IV SOLN: 500.0000 mL | INTRAVENOUS | Status: DC | PRN

## 2014-10-18 MED ORDER — TERBUTALINE SULFATE 1 MG/ML IJ SOLN
0.2500 mg | Freq: Once | INTRAMUSCULAR | Status: AC | PRN
Start: 2014-10-18 — End: 2014-10-18

## 2014-10-18 MED ORDER — OXYTOCIN BOLUS FROM INFUSION: 500.0000 mL | INTRAVENOUS | Status: DC

## 2014-10-18 MED ORDER — LACTATED RINGERS IV SOLN
INTRAVENOUS | Status: DC
  Administered 2014-10-18: 05:00:00 via INTRAVENOUS
  Administered 2014-10-18: 125 mL/h via INTRAVENOUS
  Administered 2014-10-18: 21:00:00 via INTRAVENOUS
  Administered 2014-10-18: 125 mL/h via INTRAVENOUS

## 2014-10-18 MED ORDER — FENTANYL 2.5 MCG/ML BUPIVACAINE 1/10 % EPIDURAL INFUSION (WH - ANES)
14.0000 mL/h | INTRAMUSCULAR | Status: DC | PRN
  Administered 2014-10-18 (×2): 14 mL/h via EPIDURAL
  Filled 2014-10-18 (×2): qty 125

## 2014-10-18 MED ORDER — LACTATED RINGERS IV SOLN
500.0000 mL | Freq: Once | INTRAVENOUS | Status: AC
  Administered 2014-10-18: 500 mL via INTRAVENOUS

## 2014-10-18 MED ORDER — OXYTOCIN 40 UNITS IN LACTATED RINGERS INFUSION - SIMPLE MED
62.5000 mL/h | INTRAVENOUS | Status: DC
  Administered 2014-10-18: 62.5 mL/h via INTRAVENOUS

## 2014-10-18 MED ORDER — ONDANSETRON HCL 4 MG/2ML IJ SOLN: 4.0000 mg | Freq: Four times a day (QID) | INTRAMUSCULAR | Status: DC | PRN

## 2014-10-18 MED ORDER — LIDOCAINE HCL (PF) 1 % IJ SOLN
INTRAMUSCULAR | Status: DC | PRN
  Administered 2014-10-18 (×4): 4 mL

## 2014-10-18 MED ORDER — LIDOCAINE HCL (PF) 1 % IJ SOLN
30.0000 mL | INTRAMUSCULAR | Status: DC | PRN
  Filled 2014-10-18: qty 30

## 2014-10-18 MED ORDER — OXYCODONE-ACETAMINOPHEN 5-325 MG PO TABS: 1.0000 | ORAL_TABLET | ORAL | Status: DC | PRN

## 2014-10-18 NOTE — MAU Note (Signed)
Pt in room, will apply monitors 

## 2014-10-18 NOTE — Progress Notes (Signed)
Patient ID: Jamesyn Dapaah-Ntiamoah, female   DOB: Dec 19, 1985, 28 y.o.   MRN: [id number]  Pt with period of lates/severe variables, resolved with fluid and position change.    FHTs 140's category 1 before AROM SVE 5.6/100/0 AROM for light meconium, d/w pt  IUPC placed Restart pitocin if no change in 1hr

## 2014-10-18 NOTE — Progress Notes (Signed)
Pt had large emesis on floor not measured.  Pt denies need for antimedic

## 2014-10-18 NOTE — Anesthesia Procedure Notes (Signed)
Epidural Patient location during procedure: OB Start time: 10/18/2014 8:14 AM  Staffing Anesthesiologist: Eyvette Cordon Performed by: anesthesiologist   Preanesthetic Checklist Completed: patient identified, site marked, surgical consent, pre-op evaluation, timeout performed, IV checked, risks and benefits discussed and monitors and equipment checked  Epidural Patient position: sitting Prep: site prepped and draped and DuraPrep Patient monitoring: continuous pulse ox and blood pressure Approach: midline Location: L3-L4 Injection technique: LOR air  Needle:  Needle type: Tuohy  Needle gauge: 17 G Needle length: 9 cm and 9 Needle insertion depth: 6 cm Catheter type: closed end flexible Catheter size: 19 Gauge Catheter at skin depth: 11 cm Test dose: negative  Assessment Events: blood not aspirated, injection not painful, no injection resistance, negative IV test and no paresthesia  Additional Notes Discussed risk of headache, infection, bleeding, nerve injury and failed or incomplete block.  Patient voices understanding and wishes to proceed.  Epidural placed easily on first attempt.  No paresthesia. Patient tolerated procedure well with no apparent complications.  Jasmine DecemberA. Roan Sawchuk, MDReason for block:procedure for pain

## 2014-10-18 NOTE — Anesthesia Preprocedure Evaluation (Addendum)

## 2014-10-18 NOTE — Progress Notes (Signed)
Patient ID: Rachel Atkinson, female   DOB: [birth year], 28 y.o.   MRN: [id number]  No c/o's, comfortable with epidural  AFVSS gen NAD FHTs 160's, good var, category 1 toco Q 2-624min  SVE 8/90/0  Continue for current mgmt

## 2014-10-18 NOTE — Progress Notes (Addendum)
Patient ID: Rachel Atkinson, female   DOB: 03-21-1986, 28 y.o.   MRN: [id number]  Comfortable with epidural  AFVSS gen NAD  After restarting pitocin, had more variables and several lates, pitocin turned off - recovered  140, good var, category 1 toco q 5min, mVu 75-120  Will restart pitocin at 1 mU/min after resting baby for 30min  D/W pt chance of need for LTCS due to fetal intolerance of labor.   D/W pt and partner r/b/a of LTCS including: bleeding, infection, damage to surrounding organs, injury to infant and trouble healing.  Pt voices understanding, no questions.

## 2014-10-18 NOTE — Progress Notes (Signed)
Patient ID: Rachel Atkinson, female   DOB: 18-Jul-1986, 28 y.o.   MRN: [id number]  Very uncomfortable with contractions.    AFVSS gen NAD, uncomf with ctx FHTs 140's, mod var, category 1 toco q 3min  SVE 3/90/0  Will get epidural for comfort AROM after comfortab;e

## 2014-10-18 NOTE — H&P (Signed)
Rachel Atkinson is a 28 y.o. female G2P0010 at 1439 2/7 (EDD 10/23/14 by 6 week US) presenting for second time in 48 hours with c/o contractions that she rates a 10/10.  Prenatal care significant for transfer to our group at 23 weeks.  SHe had some episodes of fainting a few times in pregnancy, but always followed brushing her teeth and gagging.  No other issues.  Maternal Medical History:  Reason for admission: Contractions.   Contractions: Onset was 2 days ago.   Frequency: regular.   Perceived severity is moderate.    Fetal activity: Perceived fetal activity is normal.    Prenatal Complications - Diabetes: none.    OB History    Gravida Para Term Preterm AB TAB SAB Ectopic Multiple Living   2 0 0 0 1  1   0    SAB x1  Past Medical History  Diagnosis Date  . Medical history non-contributory   . History of lumpectomy of left breast    Past Surgical History  Procedure Laterality Date  . Breast surgery Left    Family History: family history is negative for Alcohol abuse. Social History:  reports that she has never smoked. She has never used smokeless tobacco. She reports that she does not drink alcohol or use illicit drugs.   Prenatal Transfer Tool  Maternal Diabetes: No Genetic Screening: Normal Maternal Ultrasounds/Referrals: Normal Fetal Ultrasounds or other Referrals:  None Maternal Substance Abuse:  No Significant Maternal Medications:  None Significant Maternal Lab Results:  None Other Comments:  None  Review of Systems  Gastrointestinal: Positive for abdominal pain.  Cervix 80/2/-1   Blood pressure 115/88, pulse 83, resp. rate 18, last menstrual period 01/11/2014. Maternal Exam:  Uterine Assessment: Contraction strength is moderate.  Contraction frequency is regular.   Abdomen: Fetal presentation: vertex  Introitus: Normal vulva. Normal vagina.    Physical Exam  Constitutional: She appears well-developed and well-nourished.  Cardiovascular: Normal  rate.   Respiratory: Effort normal and breath sounds normal.  GI: Soft.  Genitourinary: Vagina normal.  Neurological: She is alert.  Psychiatric: She has a normal mood and affect. Her behavior is normal.    Prenatal labs: ABO, Rh: O/POS/-- (07/27 1637) Antibody: NEG (07/27 1637) Rubella: 7.01 (07/27 1637) RPR: NON REAC (07/27 1637)  HBsAg: NEGATIVE (07/27 1637)  HIV: NONREACTIVE (07/27 1637)  GBS: Negative (06/03 0000)  One hour GTT 85 First trimester screen and AFP negative  Assessment/Plan: D/Atkinson pt early labor and offered admission with pain management and possible augmentation if does not progress. Pt agreeable to plan.  Desires epidural.  Rachel Atkinson,Rachel Atkinson 10/18/2014, 4:55 AM

## 2014-10-18 NOTE — MAU Note (Signed)
Report given to [facility], RN/ Pt may transfer to room 173

## 2014-10-18 NOTE — MAU Note (Signed)
Pt states that she is having contractions every 5 minutes. Denies SROM. States positive fetal movement. Denies bright red vaginal bleeding. Denies MRSA or STI's. Denies any pregnancy complications.

## 2014-10-19 LAB — CBC
HCT: 31.8 % — ABNORMAL LOW (ref 36.0–46.0)
HEMOGLOBIN: 11 g/dL — AB (ref 12.0–15.0)
MCH: 33 pg (ref 26.0–34.0)
MCHC: 34.6 g/dL (ref 30.0–36.0)
MCV: 95.5 fL (ref 78.0–100.0)
Platelets: 241 10*3/uL (ref 150–400)
RBC: 3.33 MIL/uL — ABNORMAL LOW (ref 3.87–5.11)
RDW: 12.8 % (ref 11.5–15.5)
WBC: 12.3 10*3/uL — AB (ref 4.0–10.5)

## 2014-10-19 MED ORDER — OXYCODONE-ACETAMINOPHEN 5-325 MG PO TABS
2.0000 | ORAL_TABLET | ORAL | Status: DC | PRN
Start: 2014-10-19 — End: 2014-10-20
  Administered 2014-10-19: 2 via ORAL
  Filled 2014-10-19: qty 2

## 2014-10-19 MED ORDER — DIBUCAINE 1 % RE OINT: 1.0000 "application " | TOPICAL_OINTMENT | RECTAL | Status: DC | PRN

## 2014-10-19 MED ORDER — LACTATED RINGERS IV SOLN: INTRAVENOUS | Status: DC

## 2014-10-19 MED ORDER — PRENATAL MULTIVITAMIN CH
1.0000 | ORAL_TABLET | Freq: Every day | ORAL | Status: DC
  Administered 2014-10-19 – 2014-10-20 (×2): 1 via ORAL
  Filled 2014-10-19 (×2): qty 1

## 2014-10-19 MED ORDER — BENZOCAINE-MENTHOL 20-0.5 % EX AERO
1.0000 "application " | INHALATION_SPRAY | CUTANEOUS | Status: DC | PRN
  Administered 2014-10-20: 1 via TOPICAL
  Filled 2014-10-19 (×2): qty 56

## 2014-10-19 MED ORDER — ONDANSETRON HCL 4 MG/2ML IJ SOLN: 4.0000 mg | INTRAMUSCULAR | Status: DC | PRN

## 2014-10-19 MED ORDER — ZOLPIDEM TARTRATE 5 MG PO TABS: 5.0000 mg | ORAL_TABLET | Freq: Every evening | ORAL | Status: DC | PRN

## 2014-10-19 MED ORDER — OXYCODONE-ACETAMINOPHEN 5-325 MG PO TABS: 1.0000 | ORAL_TABLET | ORAL | Status: DC | PRN

## 2014-10-19 MED ORDER — IBUPROFEN 600 MG PO TABS
600.0000 mg | ORAL_TABLET | Freq: Four times a day (QID) | ORAL | Status: DC
  Administered 2014-10-19 – 2014-10-20 (×6): 600 mg via ORAL
  Filled 2014-10-19 (×6): qty 1

## 2014-10-19 MED ORDER — TETANUS-DIPHTH-ACELL PERTUSSIS 5-2.5-18.5 LF-MCG/0.5 IM SUSP: 0.5000 mL | Freq: Once | INTRAMUSCULAR | Status: DC

## 2014-10-19 MED ORDER — SENNOSIDES-DOCUSATE SODIUM 8.6-50 MG PO TABS
2.0000 | ORAL_TABLET | ORAL | Status: DC
  Administered 2014-10-19: 2 via ORAL
  Administered 2014-10-19 – 2014-10-20 (×2): 1 via ORAL
  Filled 2014-10-19: qty 1
  Filled 2014-10-19 (×2): qty 2

## 2014-10-19 MED ORDER — LANOLIN HYDROUS EX OINT: TOPICAL_OINTMENT | CUTANEOUS | Status: DC | PRN

## 2014-10-19 MED ORDER — SIMETHICONE 80 MG PO CHEW: 80.0000 mg | CHEWABLE_TABLET | ORAL | Status: DC | PRN

## 2014-10-19 MED ORDER — WITCH HAZEL-GLYCERIN EX PADS: 1.0000 "application " | MEDICATED_PAD | CUTANEOUS | Status: DC | PRN

## 2014-10-19 MED ORDER — ONDANSETRON HCL 4 MG PO TABS: 4.0000 mg | ORAL_TABLET | ORAL | Status: DC | PRN

## 2014-10-19 MED ORDER — DIPHENHYDRAMINE HCL 25 MG PO CAPS: 25.0000 mg | ORAL_CAPSULE | Freq: Four times a day (QID) | ORAL | Status: DC | PRN

## 2014-10-19 NOTE — Anesthesia Postprocedure Evaluation (Signed)
Anesthesia Post Note  Patient: Rachel Atkinson  Procedure(s) Performed: * No procedures listed *  Anesthesia type: Epidural  Patient location: Mother/Baby  Post pain: Pain level controlled  Post assessment: Post-op Vital signs reviewed  Last Vitals:  Filed Vitals:   10/19/14 0529  BP: 118/56  Pulse: 66  Temp: 36.9 C  Resp: 17    Post vital signs: Reviewed  Level of consciousness:alert  Complications: No apparent anesthesia complications

## 2014-10-19 NOTE — Lactation Note (Signed)
This note was copied from the chart of Rachel Season Atkinson. Lactation Consultation Note: Initial visit with mom- RN called for assist because baby was biting and wouldn''t open wide. Baby getting fussy. Assisted in football position and baby open wide and latched. Mom reports some pain in the beginning of the feeding that then eases off. Reviewed hand expression and breast compressions while nursing. Reviewed basic teaching with parents. Reviewed feeding cues and encouraged to feed whenever she sees them Baby has had a couple of bottles of formula. Encouraged to always breast feed first to promote a good milk supply Nursed for 15 minutes on left breast and still nursing on the right when I left room. Bf brochure given with resources for support after DC. No further questions at present. To call prn  Patient Name: Rachel Atkinson Today's Date: 10/19/2014 Reason for consult: Initial assessment   Maternal Data Formula Feeding for Exclusion: No Has patient been taught Hand Expression?: Yes Does the patient have breastfeeding experience prior to this delivery?: No  Feeding Feeding Type: Breast Fed Length of feed:  (sleepy)  LATCH Score/Interventions Latch: Grasps breast easily, tongue down, lips flanged, rhythmical sucking.  Audible Swallowing: A few with stimulation  Type of Nipple: Everted at rest and after stimulation  Comfort (Breast/Nipple): Filling, red/small blisters or bruises, mild/mod discomfort  Problem noted: Mild/Moderate discomfort Interventions (Mild/moderate discomfort): Hand expression;Hand massage  Hold (Positioning): Assistance needed to correctly position infant at breast and maintain latch. Intervention(s): Breastfeeding basics reviewed;Support Pillows;Position options;Skin to skin  LATCH Score: 7  Lactation Tools Discussed/Used     Consult Status Consult Status: Follow-up Date: 10/20/14 Follow-up type: In-patient    Rachel Atkinson, Rachel Atkinson  D 10/19/2014, 2:27 PM

## 2014-10-19 NOTE — Progress Notes (Signed)
Post Partum Day 1 Subjective: no complaints, up ad lib, tolerating PO and nl lochia, pain controlled  Objective: Blood pressure 118/56, pulse 66, temperature 98.4 F (36.9 C), temperature source Axillary, resp. rate 17, height 5\' 6"  (1.676 m), weight 86.637 kg (191 lb), last menstrual period 01/11/2014, unknown if currently breastfeeding.  Physical Exam:  General: alert and no distress Lochia: appropriate Uterine Fundus: firm   Recent Labs  10/18/14 0525 10/19/14 0525  HGB 12.2 11.0*  HCT 35.8* 31.8*    Assessment/Plan: Plan for discharge tomorrow, Breastfeeding and Lactation consult.  Routine PP care.     LOS: 1 day   Bovard-Stuckert, Kailo Kosik 10/19/2014, 7:45 AM

## 2014-10-20 MED ORDER — OXYCODONE-ACETAMINOPHEN 5-325 MG PO TABS: 1.0000 | ORAL_TABLET | Freq: Four times a day (QID) | ORAL | Status: DC | PRN

## 2014-10-20 MED ORDER — IBUPROFEN 800 MG PO TABS: 800.0000 mg | ORAL_TABLET | Freq: Three times a day (TID) | ORAL | Status: DC | PRN

## 2014-10-20 MED ORDER — PRENATAL MULTIVITAMIN CH: 1.0000 | ORAL_TABLET | Freq: Every day | ORAL | Status: AC

## 2014-10-20 NOTE — Progress Notes (Signed)
UR chart review completed.  

## 2014-10-20 NOTE — Progress Notes (Signed)
Post Partum Day 2 Subjective: no complaints, up ad lib, tolerating PO and nl lochia, pain controlled  Objective: Blood pressure 125/64, pulse 56, temperature 98.6 F (37 C), temperature source Oral, resp. rate 18, height 5\' 6"  (1.676 m), weight 86.637 kg (191 lb), last menstrual period 01/11/2014, SpO2 100 %, unknown if currently breastfeeding.  Physical Exam:  General: alert and no distress Lochia: appropriate Uterine Fundus: firm   Recent Labs  10/18/14 0525 10/19/14 0525  HGB 12.2 11.0*  HCT 35.8* 31.8*    Assessment/Plan: Plan for discharge tomorrow, Breastfeeding and Lactation consult.  Routine care.  D/C with motrin, percocet, and PNV.  F/u 6 wks.     LOS: 2 days   Bovard-Stuckert, Elin Fenley 10/20/2014, 8:31 AM

## 2014-10-20 NOTE — Lactation Note (Signed)
This note was copied from the chart of Rachel Atkinson. Lactation Consultation Note  Patient Name: Rachel Georgian Atkinson Today's Date: 10/20/2014 Reason for consult: Follow-up assessment Mom has been giving mostly bottles. She plans to breast/bottle. She has DEBP at home. LC assisted Mom with latching baby at this visit. Mom's nipples are flat but very compressible. Repeated attempts needed to get baby to obtain good depth. Demonstrated breast compression to help with latch and Mom able to demonstrate back. With stimulation baby would sustain a suckling pattern, 1-2 swallows noted. Encouraged Mom if she wants to breastfeed, she needs to BF with each feeding to encourage milk production, prevent engorgement and protect milk supply. Advised Mom is she does not BF each feeding then she needs to pump for 15-20 minutes every 3 hours. Advised Mom if baby goes to breast each feeding, try to BF on both breasts each feeding for 15-20 minutes, then post pump while FOB gives supplement till her milk comes in. Guidelines for supplementing with BF reviewed with Mom. However, if baby does not go to the breast then increase supplements to 30 ml each feeding increasing to satisfy baby. Engorgement care reviewed if needed, advised of OP services and support group. Mom has some nipple tenderness, care for sore nipples reviewed, comfort gels given with instructions.   Maternal Data    Feeding Feeding Type: Breast Fed Length of feed: 15 min  LATCH Score/Interventions Latch: Repeated attempts needed to sustain latch, nipple held in mouth throughout feeding, stimulation needed to elicit sucking reflex.  Audible Swallowing: A few with stimulation  Type of Nipple: Flat  Comfort (Breast/Nipple): Filling, red/small blisters or bruises, mild/mod discomfort  Problem noted: Mild/Moderate discomfort Interventions (Mild/moderate discomfort): Comfort gels;Hand massage;Hand expression  Hold (Positioning):  Assistance needed to correctly position infant at breast and maintain latch. Intervention(s): Breastfeeding basics reviewed;Support Pillows;Position options;Skin to skin  LATCH Score: 5  Lactation Tools Discussed/Used     Consult Status Consult Status: Complete Date: 10/20/14 Follow-up type: In-patient    Alfred LevinsGranger, Geana Walts Ann 10/20/2014, 12:02 PM

## 2014-10-20 NOTE — Discharge Summary (Signed)
Obstetric Discharge Summary Reason for Admission: induction of labor Prenatal Procedures: none Intrapartum Procedures: spontaneous vaginal delivery Postpartum Procedures: none Complications-Operative and Postpartum: 2nd degree perineal laceration HEMOGLOBIN  Date Value Ref Range Status  10/19/2014 11.0* 12.0 - 15.0 g/dL Final   HCT  Date Value Ref Range Status  10/19/2014 31.8* 36.0 - 46.0 % Final    Physical Exam:  General: alert and no distress Lochia: appropriate Uterine Fundus: firm  Discharge Diagnoses: Term Pregnancy-delivered  Discharge Information: Date: 10/20/2014 Activity: pelvic rest Diet: routine Medications: PNV, Ibuprofen and Percocet Condition: stable Instructions: refer to practice specific booklet Discharge to: home Follow-up Information    Follow up with Bovard-Stuckert, Cynara Tatham, MD. Schedule an appointment as soon as possible for a visit in 6 weeks.   Specialty:  Obstetrics and Gynecology   Why:  for postpartum check   Contact information:   [address] [place] [id number] [phone]       Newborn Data: Live born female  Birth Weight: 7 lb 1.4 oz (3215 g) APGAR: 8, 9  Home with mother.  Bovard-Stuckert, Fallon Haecker 10/20/2014, 8:59 AM

## 2016-07-07 ENCOUNTER — Encounter (HOSPITAL_COMMUNITY): Payer: Self-pay | Admitting: *Deleted

## 2016-07-07 ENCOUNTER — Inpatient Hospital Stay (HOSPITAL_COMMUNITY)
Admission: AD | Admit: 2016-07-07 | Discharge: 2016-07-07 | Disposition: A | Discharge status: Home / Self Care | Payer: Medicaid Other | Source: Ambulatory Visit | Attending: Obstetrics and Gynecology | Admitting: Obstetrics and Gynecology

## 2016-07-07 DIAGNOSIS — Z3200 Encounter for pregnancy test, result unknown: Secondary | ICD-10-CM

## 2016-07-07 DIAGNOSIS — Z331 Pregnant state, incidental: Secondary | ICD-10-CM | POA: Insufficient documentation

## 2016-07-07 DIAGNOSIS — R5383 Other fatigue: Secondary | ICD-10-CM | POA: Insufficient documentation

## 2016-07-07 NOTE — MAU Note (Signed)
Pt reports she has been feeling tired/fatigued for a few weeks now. Took HPT x2 it both were positive. No other complaints. Just worried because she has been exercising and wears a corsette whens she exercises and wants to make sure the baby was not harmed from that.

## 2016-07-07 NOTE — Discharge Instructions (Signed)
BASED ON YOUR LMP OF 06/06/16 YOUR DUE DATE IS 07/07/16 AND YOU ARE 4 WEEKS AND 3 DAYS.   Prenatal Care WHAT IS PRENATAL CARE?  Prenatal care is the process of caring for a pregnant woman before she gives birth. Prenatal care makes sure that she and her baby remain as healthy as possible throughout pregnancy. Prenatal care may be provided by a midwife, family practice health care provider, or a childbirth and pregnancy specialist (obstetrician). Prenatal care may include physical examinations, testing, treatments, and education on nutrition, lifestyle, and social support services. WHY IS PRENATAL CARE SO IMPORTANT?  Early and consistent prenatal care increases the chance that you and your baby will remain healthy throughout your pregnancy. This type of care also decreases a baby's risk of being born too early (prematurely), or being born smaller than expected (small for gestational age). Any underlying medical conditions you may have that could pose a risk during your pregnancy are discussed during prenatal care visits. You will also be monitored regularly for any new conditions that may arise during your pregnancy so they can be treated quickly and effectively. WHAT HAPPENS DURING PRENATAL CARE VISITS? Prenatal care visits may include the following: Discussion Tell your health care provider about any new signs or symptoms you have experienced since your last visit. These might include:  Nausea or vomiting.  Increased or decreased level of energy.  Difficulty sleeping.  Back or leg pain.  Weight changes.  Frequent urination.  Shortness of breath with physical activity.  Changes in your skin, such as the development of a rash or itchiness.  Vaginal discharge or bleeding.  Feelings of excitement or nervousness.  Changes in your baby's movements. You may want to write down any questions or topics you want to discuss with your health care provider and bring them with you to your  appointment. Examination During your first prenatal care visit, you will likely have a complete physical exam. Your health care provider will often examine your vagina, cervix, and the position of your uterus, as well as check your heart, lungs, and other body systems. As your pregnancy progresses, your health care provider will measure the size of your uterus and your baby's position inside your uterus. He or she may also examine you for early signs of labor. Your prenatal visits may also include checking your blood pressure and, after about 10-12 weeks of pregnancy, listening to your baby's heartbeat. Testing Regular testing often includes:  Urinalysis. This checks your urine for glucose, protein, or signs of infection.  Blood count. This checks the levels of white and red blood cells in your body.  Tests for sexually transmitted infections (STIs). Testing for STIs at the beginning of pregnancy is routinely done and is required in many states.  Antibody testing. You will be checked to see if you are immune to certain illnesses, such as rubella, that can affect a developing fetus.  Glucose screen. Around 24-28 weeks of pregnancy, your blood glucose level will be checked for signs of gestational diabetes. Follow-up tests may be recommended.  Group B strep. This is a bacteria that is commonly found inside a woman's vagina. This test will inform your health care provider if you need an antibiotic to reduce the amount of this bacteria in your body prior to labor and childbirth.  Ultrasound. Many pregnant women undergo an ultrasound screening around 18-20 weeks of pregnancy to evaluate the health of the fetus and check for any developmental abnormalities.  HIV (human immunodeficiency virus)  testing. Early in your pregnancy, you will be screened for HIV. If you are at high risk for HIV, this test may be repeated during your third trimester of pregnancy. You may be offered other testing based on your  age, personal or family medical history, or other factors.  HOW OFTEN SHOULD I PLAN TO SEE MY HEALTH CARE PROVIDER FOR PRENATAL CARE? Your prenatal care check-up schedule depends on any medical conditions you have before, or develop during, your pregnancy. If you do not have any underlying medical conditions, you will likely be seen for checkups:  Monthly, during the first 6 months of pregnancy.  Twice a month during months 7 and 8 of pregnancy.  Weekly starting in the 9th month of pregnancy and until delivery. If you develop signs of early labor or other concerning signs or symptoms, you may need to see your health care provider more often. Ask your health care provider what prenatal care schedule is best for you. WHAT CAN I DO TO KEEP MYSELF AND MY BABY AS HEALTHY AS POSSIBLE DURING MY PREGNANCY?  Take a prenatal vitamin containing 400 micrograms (0.4 mg) of folic acid every day. Your health care provider may also ask you to take additional vitamins such as iodine, vitamin D, iron, copper, and zinc.  Take 1500-2000 mg of calcium daily starting at your 20th week of pregnancy until you deliver your baby.  Make sure you are up to date on your vaccinations. Unless directed otherwise by your health care provider:  You should receive a tetanus, diphtheria, and pertussis (Tdap) vaccination between the 27th and 36th week of your pregnancy, regardless of when your last Tdap immunization occurred. This helps protect your baby from whooping cough (pertussis) after he or she is born.  You should receive an annual inactivated influenza vaccine (IIV) to help protect you and your baby from influenza. This can be done at any point during your pregnancy.  Eat a well-rounded diet that includes:  Fresh fruits and vegetables.  Lean proteins.  Calcium-rich foods such as milk, yogurt, hard cheeses, and dark, leafy greens.  Whole grain breads.  Do noteat seafood high in mercury,  including:  Swordfish.  Tilefish.  Shark.  King mackerel.  More than 6 oz tuna per week.  Do not eat:  Raw or undercooked meats or eggs.  Unpasteurized foods, such as soft cheeses (brie, blue, or feta), juices, and milks.  Lunch meats.  Hot dogs that have not been heated until they are steaming.  Drink enough water to keep your urine clear or pale yellow. For many women, this may be 10 or more 8 oz glasses of water each day. Keeping yourself hydrated helps deliver nutrients to your baby and may prevent the start of pre-term uterine contractions.  Do not use any tobacco products including cigarettes, chewing tobacco, or electronic cigarettes. If you need help quitting, ask your health care provider.  Do not drink beverages containing alcohol. No safe level of alcohol consumption during pregnancy has been determined.  Do not use any illegal drugs. These can harm your developing baby or cause a miscarriage.  Ask your health care provider or pharmacist before taking any prescription or over-the-counter medicines, herbs, or supplements.  Limit your caffeine intake to no more than 200 mg per day.  Exercise. Unless told otherwise by your health care provider, try to get 30 minutes of moderate exercise most days of the week. Do not  do high-impact activities, contact sports, or activities with a high  risk of falling, such as horseback riding or downhill skiing.  Get plenty of rest.  Avoid anything that raises your body temperature, such as hot tubs and saunas.  If you own a cat, do not empty its litter box. Bacteria contained in cat feces can cause an infection called toxoplasmosis. This can result in serious harm to the fetus.  Stay away from chemicals such as insecticides, lead, mercury, and cleaning or paint products that contain solvents.  Do not have any X-rays taken unless medically necessary.  Take a childbirth and breastfeeding preparation class. Ask your health care  provider if you need a referral or recommendation.   This information is not intended to replace advice given to you by your health care provider. Make sure you discuss any questions you have with your health care provider.   Document Released: 10/25/2003 Document Revised: 11/12/2014 Document Reviewed: 01/06/2014 Elsevier Interactive Patient Education [employer]2016 [employer].

## 2016-07-07 NOTE — MAU Provider Note (Signed)
History   [id number]   Chief Complaint  Patient presents with  . Fatigue  . Possible Pregnancy    HPI Rachel Atkinson is a 30 y.o. female G2P1011 here for confirmation of pregnancy.   Patient's last menstrual period was 06/06/2016.Marland Kitchen.  Denies any vaginal bleeding or abdominal pain.  All other systems within normal limits.  [facility]  Four positive pregnancy tests at home.  Patient's last menstrual period was 06/06/2016.  OB History  Gravida Para Term Preterm AB Living  2 1 1  0 1 1  SAB TAB Ectopic Multiple Live Births  1     0 1    # Outcome Date GA Lbr Len/2nd Weight Sex Delivery Anes PTL Lv  2 Term 10/18/14 6420w2d 17:31 / 00:42 7 lb 1.4 oz (3.215 kg) F Vag-Spont EPI  LIV     Birth Comments:  Laboratory Number: [id number] 3215 grams NBS Device Barcode: [id number]  Laboratory Results  CAH: Normal GALACTOSEMIA: GALT 10.0 U/gHb Normal Ref Range ( >= 2.2 ) U/g Hb Total Galactose 2.7 mg/dl Normal Ref Range ( < 86.515.0 ) mg/dL THYROID: Normal BIOTINIDASE: Normal HEMOGLOBIN: Normal, FA CYSTIC FIBROSIS: Normal AMINO ACID PROFILE: Normal ACYLCARNITINE PROFILE: Normal   1 SAB               Past Medical History:  Diagnosis Date  . History of lumpectomy of left breast   . Medical history non-contributory   . SVD (spontaneous vaginal delivery) 10/18/2014    Family History  Problem Relation Age of Onset  . Alcohol abuse Neg Hx     Social History   Social History  . Marital status: Married    Spouse name: N/A  . Number of children: N/A  . Years of education: N/A   Social History Main Topics  . Smoking status: Never Smoker  . Smokeless tobacco: Never Used  . Alcohol use No  . Drug use: No  . Sexual activity: Yes    Birth control/ protection: None   Other Topics Concern  . None   Social History Narrative  . None    No Known Allergies  No current facility-administered medications on file prior to encounter.     Current Outpatient Prescriptions on File Prior to Encounter  Medication Sig Dispense Refill  . ibuprofen (ADVIL,MOTRIN) 800 MG tablet Take 1 tablet (800 mg total) by mouth every 8 (eight) hours as needed. 40 tablet 1  . oxyCODONE-acetaminophen (PERCOCET/ROXICET) 5-325 MG per tablet Take 1-2 tablets by mouth every 6 (six) hours as needed for moderate pain. 15 tablet 0  . Prenatal Vit-Fe Fumarate-FA (PRENATAL MULTIVITAMIN) TABS tablet Take 1 tablet by mouth daily at 12 noon. 30 tablet 12     Physical Exam   Vitals:   07/07/16 1510  BP: 111/56  Pulse: 60  Resp: 18  Temp: 98.9 F (37.2 C)  TempSrc: Oral  Weight: 183 lb 12.8 oz (83.4 kg)  Height: 5\' 7"  (1.702 m)    Physical Exam  Constitutional: She is oriented to person, place, and time. She appears well-developed and well-nourished. No distress.  HENT:  Head: Normocephalic.  Neck: Normal range of motion. Neck supple.  Cardiovascular: Normal rate, regular rhythm and normal heart sounds.   Respiratory: Effort normal and breath sounds normal. No respiratory distress.  Musculoskeletal: Normal range of motion.  Neurological: She is alert and oriented to person, place, and time. She has normal reflexes.  Skin: Skin is warm and dry.  MEDICAL SCREENING EXAM COMPLETED MAU Course  Procedures   Assessment and Plan  Encounter for Pregnancy Test  Explained benefits of taking prenatal vitamins w/folic acid early in pregnancy. Provided resources for pregnancy testing only Encouraged her to schedule an appt at [facility] Begin prenatal care. Reviewed warning signs of pregnancy.  Marlis Edelson, CNM 07/07/2016 3:27 PM

## 2016-07-12 ENCOUNTER — Encounter: Payer: Self-pay | Admitting: *Deleted

## 2016-07-12 ENCOUNTER — Ambulatory Visit (INDEPENDENT_AMBULATORY_CARE_PROVIDER_SITE_OTHER): Payer: Self-pay | Admitting: *Deleted

## 2016-07-12 ENCOUNTER — Encounter: Payer: Self-pay | Admitting: Family Medicine

## 2016-07-12 DIAGNOSIS — Z3201 Encounter for pregnancy test, result positive: Secondary | ICD-10-CM

## 2016-07-12 LAB — POCT PREGNANCY, URINE: PREG TEST UR: POSITIVE — AB

## 2016-07-12 NOTE — Progress Notes (Signed)
Pt in for pregnancy test. Results are positive. Medications and Allergies reviewed with patient.  Pregnancy verification letter given. Patient has contact numbers to start prenatal care. She has no further questions or concerns.

## 2016-07-27 ENCOUNTER — Encounter (HOSPITAL_COMMUNITY): Payer: Self-pay | Admitting: *Deleted

## 2016-07-27 ENCOUNTER — Inpatient Hospital Stay (HOSPITAL_COMMUNITY)
Admission: AD | Admit: 2016-07-27 | Discharge: 2016-07-27 | Disposition: A | Discharge status: Home / Self Care | Payer: Self-pay | Source: Ambulatory Visit | Attending: Obstetrics and Gynecology | Admitting: Obstetrics and Gynecology

## 2016-07-27 DIAGNOSIS — Z3A01 Less than 8 weeks gestation of pregnancy: Secondary | ICD-10-CM | POA: Insufficient documentation

## 2016-07-27 DIAGNOSIS — O210 Mild hyperemesis gravidarum: Secondary | ICD-10-CM | POA: Insufficient documentation

## 2016-07-27 DIAGNOSIS — O26811 Pregnancy related exhaustion and fatigue, first trimester: Secondary | ICD-10-CM

## 2016-07-27 DIAGNOSIS — O219 Vomiting of pregnancy, unspecified: Secondary | ICD-10-CM

## 2016-07-27 LAB — CBC
HCT: 33.4 % — ABNORMAL LOW (ref 36.0–46.0)
Hemoglobin: 11.6 g/dL — ABNORMAL LOW (ref 12.0–15.0)
MCH: 32.5 pg (ref 26.0–34.0)
MCHC: 34.7 g/dL (ref 30.0–36.0)
MCV: 93.6 fL (ref 78.0–100.0)
Platelets: 286 10*3/uL (ref 150–400)
RBC: 3.57 MIL/uL — AB (ref 3.87–5.11)
RDW: 13.1 % (ref 11.5–15.5)
WBC: 4.8 10*3/uL (ref 4.0–10.5)

## 2016-07-27 LAB — URINALYSIS, ROUTINE W REFLEX MICROSCOPIC
Bilirubin Urine: NEGATIVE
Glucose, UA: NEGATIVE mg/dL
Hgb urine dipstick: NEGATIVE
Ketones, ur: NEGATIVE mg/dL
Leukocytes, UA: NEGATIVE
NITRITE: NEGATIVE
PH: 6 (ref 5.0–8.0)
Protein, ur: NEGATIVE mg/dL

## 2016-07-27 MED ORDER — PROMETHAZINE HCL 25 MG PO TABS: 25.0000 mg | ORAL_TABLET | Freq: Four times a day (QID) | ORAL | 0 refills | Status: AC | PRN

## 2016-07-27 MED ORDER — MECLIZINE HCL 25 MG PO TABS: 25.0000 mg | ORAL_TABLET | Freq: Three times a day (TID) | ORAL | 0 refills | Status: AC | PRN

## 2016-07-27 MED ORDER — MECLIZINE HCL 25 MG PO TABS
25.0000 mg | ORAL_TABLET | Freq: Once | ORAL | Status: AC
  Administered 2016-07-27: 25 mg via ORAL
  Filled 2016-07-27: qty 1

## 2016-07-27 NOTE — Discharge Instructions (Signed)

## 2016-07-27 NOTE — MAU Note (Signed)
Knows that every preg is different.  Fainted a lot in 2nd trimester with last preg.  Is throwing up every time she eats.  Feels tired and weak.

## 2016-07-27 NOTE — MAU Provider Note (Signed)
History     CSN: [id number]  Arrival date and time: 07/27/16 1318   None     Chief Complaint  Patient presents with  . Morning Sickness  . Fatigue   HPI  Pt is 30 y.o.G3P1011 4667w2d who presents with vomiting in pregnancy and feeling tired and weak.  Pt denies abd pain, spotting, bleeding or UTI sx. Pt has not taken anything for the sx. RN note: Knows that every preg is different.  Fainted a lot in 2nd trimester with last preg.  Is throwing up every time she eats.  Feels tired and weak  Past Medical History:  Diagnosis Date  . History of lumpectomy of left breast   . Medical history non-contributory   . SVD (spontaneous vaginal delivery) 10/18/2014    Past Surgical History:  Procedure Laterality Date  . BREAST SURGERY Left     Family History  Problem Relation Age of Onset  . Alcohol abuse Neg Hx     Social History  Substance Use Topics  . Smoking status: Never Smoker  . Smokeless tobacco: Never Used  . Alcohol use No    Allergies: No Known Allergies  Prescriptions Prior to Admission  Medication Sig Dispense Refill Last Dose  . Prenatal Vit-Fe Fumarate-FA (PRENATAL MULTIVITAMIN) TABS tablet Take 1 tablet by mouth daily at 12 noon. 30 tablet 12 Taking    Review of Systems  Constitutional: Negative for chills and fever.  Gastrointestinal: Positive for nausea. Negative for abdominal pain, constipation, diarrhea and vomiting.  Genitourinary: Negative for dysuria.  Neurological: Negative for headaches.   Physical Exam   Blood pressure 113/55, pulse 76, temperature 98.7 F (37.1 C), temperature source Oral, resp. rate 16, weight 185 lb 4 oz (84 kg), last menstrual period 06/06/2016, unknown if currently breastfeeding.  Physical Exam  Nursing note and vitals reviewed. Constitutional: She is oriented to person, place, and time. She appears well-developed and well-nourished. No distress.  HENT:  Head: Normocephalic.  Eyes: Pupils are equal, round, and reactive  to light.  Neck: Normal range of motion. Neck supple.  Cardiovascular: Normal rate.   Respiratory: Effort normal.  GI: Soft.  Musculoskeletal: Normal range of motion.  Neurological: She is alert and oriented to person, place, and time.  Skin: Skin is warm.  Psychiatric: She has a normal mood and affect.    MAU Course  Procedures Meclezine 25mg  given with satisfactory results Pt not dehydrated Encouraged small frequent meals Pt to make OB appointment when medicaid completed Results for orders placed or performed during the hospital encounter of 07/27/16 (from the past 24 hour(s))  Urinalysis, Routine w reflex microscopic (not [facility])     Status: Abnormal   Collection Time: 07/27/16  1:30 PM  Result Value Ref Range   Color, Urine YELLOW YELLOW   APPearance CLEAR CLEAR   Specific Gravity, Urine <1.005 (L) 1.005 - 1.030   pH 6.0 5.0 - 8.0   Glucose, UA NEGATIVE NEGATIVE mg/dL   Hgb urine dipstick NEGATIVE NEGATIVE   Bilirubin Urine NEGATIVE NEGATIVE   Ketones, ur NEGATIVE NEGATIVE mg/dL   Protein, ur NEGATIVE NEGATIVE mg/dL   Nitrite NEGATIVE NEGATIVE   Leukocytes, UA NEGATIVE NEGATIVE  CBC     Status: Abnormal   Collection Time: 07/27/16  4:25 PM  Result Value Ref Range   WBC 4.8 4.0 - 10.5 K/uL   RBC 3.57 (L) 3.87 - 5.11 MIL/uL   Hemoglobin 11.6 (L) 12.0 - 15.0 g/dL   HCT 40.933.4 (L) 81.136.0 - 91.446.0 %  MCV 93.6 78.0 - 100.0 fL   MCH 32.5 26.0 - 34.0 pg   MCHC 34.7 30.0 - 36.0 g/dL   RDW 69.6 29.5 - 28.4 %   Platelets 286 150 - 400 K/uL    Assessment and Plan  Morning sickness- Rx Meclezine, phenergan Continue Prenatal vitamins Small frequent meals OB care as soon as Medicaid completed or pursue [facility]  Charon Akamine 07/27/2016, 2:25 PM

## 2016-08-14 ENCOUNTER — Encounter (HOSPITAL_COMMUNITY): Payer: Self-pay | Admitting: *Deleted

## 2016-08-14 ENCOUNTER — Inpatient Hospital Stay (HOSPITAL_COMMUNITY)
Admission: AD | Admit: 2016-08-14 | Discharge: 2016-08-14 | Disposition: A | Discharge status: Home / Self Care | Payer: Self-pay | Source: Ambulatory Visit | Attending: Obstetrics and Gynecology | Admitting: Obstetrics and Gynecology

## 2016-08-14 ENCOUNTER — Inpatient Hospital Stay (HOSPITAL_COMMUNITY): Payer: Self-pay

## 2016-08-14 DIAGNOSIS — O26891 Other specified pregnancy related conditions, first trimester: Secondary | ICD-10-CM | POA: Insufficient documentation

## 2016-08-14 DIAGNOSIS — O210 Mild hyperemesis gravidarum: Secondary | ICD-10-CM | POA: Insufficient documentation

## 2016-08-14 DIAGNOSIS — R109 Unspecified abdominal pain: Secondary | ICD-10-CM | POA: Insufficient documentation

## 2016-08-14 DIAGNOSIS — O219 Vomiting of pregnancy, unspecified: Secondary | ICD-10-CM

## 2016-08-14 DIAGNOSIS — O26899 Other specified pregnancy related conditions, unspecified trimester: Secondary | ICD-10-CM

## 2016-08-14 DIAGNOSIS — Z3A09 9 weeks gestation of pregnancy: Secondary | ICD-10-CM | POA: Insufficient documentation

## 2016-08-14 LAB — URINALYSIS, ROUTINE W REFLEX MICROSCOPIC
Bilirubin Urine: NEGATIVE
Glucose, UA: NEGATIVE mg/dL
Hgb urine dipstick: NEGATIVE
Ketones, ur: NEGATIVE mg/dL
LEUKOCYTES UA: NEGATIVE
NITRITE: NEGATIVE
PH: 7 (ref 5.0–8.0)
Protein, ur: NEGATIVE mg/dL
SPECIFIC GRAVITY, URINE: 1.01 (ref 1.005–1.030)

## 2016-08-14 MED ORDER — PROMETHAZINE HCL 25 MG PO TABS
12.5000 mg | ORAL_TABLET | Freq: Once | ORAL | Status: AC
  Administered 2016-08-14: 12.5 mg via ORAL
  Filled 2016-08-14: qty 1

## 2016-08-14 NOTE — MAU Note (Signed)
C/o intermittent abdominal pain for past week; c/o Nausea and vomiting for the entire pregnancy;c/o dizziness;

## 2016-08-14 NOTE — MAU Note (Signed)
No prenatal care yet and does not have any pregnacy medicaid yet;

## 2016-08-14 NOTE — MAU Note (Signed)
Urine in lab 

## 2016-08-14 NOTE — MAU Provider Note (Signed)
Chief Complaint: Abdominal Pain; Nausea; and Emesis   First Provider Initiated Contact with Patient 08/14/16 1302      SUBJECTIVE HPI: Rachel Atkinson is a 30 y.o. G3P1011 at [redacted]w[redacted]d who presents to Maternity Admissions reporting nausea/vomiting with poor PO intake and abdominal pain.  Patient states she has been unable to eat this whole pregnancy. She can tolerate liquids every so often throughout the day but occasional will vomit with liquid intake too. She also states she is having very mild abdominal cramping in the pelvic region. Denies vaginal bleeding. Denies diarrhea, having some mild constipation where it is difficult to have BM due to hard stools, had BM this AM. Denies melena or hematochezia. She is taking PNV and able to hold that down.    Past Medical History:  Diagnosis Date  . History of lumpectomy of left breast   . Medical history non-contributory   . SVD (spontaneous vaginal delivery) 10/18/2014   OB History  Gravida Para Term Preterm AB Living  3 1 1  0 1 1  SAB TAB Ectopic Multiple Live Births  1     0 1    # Outcome Date GA Lbr Len/2nd Weight Sex Delivery Anes PTL Lv  3 Current           2 Term 10/18/14 [redacted]w[redacted]d 17:31 / 00:42 7 lb 1.4 oz (3.215 kg) F Vag-Spont EPI  LIV     Birth Comments:  Laboratory Number: [id number] grams NBS Device Barcode: [id number]  Laboratory Results  CAH: Normal GALACTOSEMIA: GALT 10.0 U/gHb Normal Ref Range ( >= 2.2 ) U/g Hb Total Galactose 2.7 mg/dl Normal Ref Range ( < 82.9 ) mg/dL THYROID: Normal BIOTINIDASE: Normal HEMOGLOBIN: Normal, FA CYSTIC FIBROSIS: Normal AMINO ACID PROFILE: Normal ACYLCARNITINE PROFILE: Normal   1 SAB              Past Surgical History:  Procedure Laterality Date  . BREAST SURGERY Left    Social History   Social History  . Marital status: Married    Spouse name: N/A  . Number of children: N/A  . Years of education: N/A   Occupational History  . Not on file.   Social History  Main Topics  . Smoking status: Never Smoker  . Smokeless tobacco: Never Used  . Alcohol use No  . Drug use: No  . Sexual activity: Yes    Birth control/ protection: None   Other Topics Concern  . Not on file   Social History Narrative  . No narrative on file   No current facility-administered medications on file prior to encounter.    Current Outpatient Prescriptions on File Prior to Encounter  Medication Sig Dispense Refill  . meclizine (ANTIVERT) 25 MG tablet Take 1 tablet (25 mg total) by mouth 3 (three) times daily as needed for nausea. 30 tablet 0  . Prenatal Vit-Fe Fumarate-FA (PRENATAL MULTIVITAMIN) TABS tablet Take 1 tablet by mouth daily at 12 noon. 30 tablet 12  . promethazine (PHENERGAN) 25 MG tablet Take 1 tablet (25 mg total) by mouth every 6 (six) hours as needed for nausea or vomiting. 30 tablet 0   No Known Allergies  I have reviewed the past Medical Hx, Surgical Hx, Social Hx, Allergies and Medications.   REVIEW OF SYSTEMS  OPHTHALMIC: negative for - blurry vision, decreased vision, double vision, photophobia or scotomata RESPIRATORY: no cough, shortness of breath, or wheezing CARDIOVASCULAR: no chest pain or dyspnea on exertion GASTROINTESTINAL: no abdominal pain, change in bowel habits,  or black or bloody stools negative for - epigastric or RUQ pain GENITO-URINARY: no dysuria, trouble voiding, or hematuria negative for - genital discharge, vulvar/vaginal symptoms or vaginal bleeding MUSKULOSKELETAL: negative for - gait disturbance or swelling in ankle - bilateral, foot - bilateral and leg - bilateral NEUROLOGICAL: negative for - dizziness, gait disturbance, headaches, numbness/tingling or visual changes DERMATOLOGICAL: negative OBSTETRICAL: No vaginal bleeding, no leaking fluid, no contractions. Good fetal movement.   OBJECTIVE Patient Vitals for the past 24 hrs:  BP Temp Temp src Pulse Resp  08/14/16 1232 111/92 99.1 F (37.3 C) Oral 84 18     PHYSICAL EXAM Constitutional: Well-developed, well-nourished female in no acute distress.  Cardiovascular: normal rate Respiratory: normal rate and effort.  GI: Abd soft, mildly tender to palpation in lower pelvic region R=L without guarding, gravid appropriate for gestational age, no fundal height palpated. Pos BS x 4 MS: Extremities nontender, no edema, normal ROM Neurologic: Alert and oriented x 4.  GU: Neg CVAT.   LAB RESULTS Results for orders placed or performed during the hospital encounter of 08/14/16 (from the past 24 hour(s))  Urinalysis, Routine w reflex microscopic (not at [facility])     Status: None   Collection Time: 08/14/16 12:25 PM  Result Value Ref Range   Color, Urine YELLOW YELLOW   APPearance CLEAR CLEAR   Specific Gravity, Urine 1.010 1.005 - 1.030   pH 7.0 5.0 - 8.0   Glucose, UA NEGATIVE NEGATIVE mg/dL   Hgb urine dipstick NEGATIVE NEGATIVE   Bilirubin Urine NEGATIVE NEGATIVE   Ketones, ur NEGATIVE NEGATIVE mg/dL   Protein, ur NEGATIVE NEGATIVE mg/dL   Nitrite NEGATIVE NEGATIVE   Leukocytes, UA NEGATIVE NEGATIVE    IMAGING US Ob Comp Less 14 Wks  Result Date: 08/14/2016 CLINICAL DATA:  Abdominal pain in first trimester pregnancy. Gestational age by LMP of 9 weeks 6 days. EXAM: OBSTETRIC <14 WK [place] AND TRANSVAGINAL OB US TECHNIQUE: Both transabdominal and transvaginal ultrasound examinations were performed for complete evaluation of the gestation as well as the maternal uterus, adnexal regions, and pelvic cul-de-sac. Transvaginal technique was performed to assess early pregnancy. COMPARISON:  None. FINDINGS: Intrauterine gestational sac: Single Yolk sac:  Visualized Embryo:  Visualized Cardiac Activity: Visualized Heart Rate: 180  bpm CRL:  25  mm   9 w   1 d                  [place] EDC: 03/18/2017 Subchorionic hemorrhage:  None visualized. Maternal uterus/adnexae: Retroverted uterus. Normal appearance of both ovaries. No masses or abnormal free fluid identified.  IMPRESSION: Single living IUP measuring 9 weeks 1 day with [place] EDC of 03/18/2017. This is concordant with LMP. No significant maternal uterine or adnexal abnormality identified. Electronically Signed   By: Myles Rosenthal M.D.   On: 08/14/2016 15:58   US Ob Transvaginal  Result Date: 08/14/2016 CLINICAL DATA:  Abdominal pain in first trimester pregnancy. Gestational age by LMP of 9 weeks 6 days. EXAM: OBSTETRIC <14 WK [place] AND TRANSVAGINAL OB US TECHNIQUE: Both transabdominal and transvaginal ultrasound examinations were performed for complete evaluation of the gestation as well as the maternal uterus, adnexal regions, and pelvic cul-de-sac. Transvaginal technique was performed to assess early pregnancy. COMPARISON:  None. FINDINGS: Intrauterine gestational sac: Single Yolk sac:  Visualized Embryo:  Visualized Cardiac Activity: Visualized Heart Rate: 180  bpm CRL:  25  mm   9 w   1 d  US EDC: 03/18/2017 Subchorionic hemorrhage:  None visualized. Maternal uterus/adnexae: Retroverted uterus. Normal appearance of both ovaries. No masses or abnormal free fluid identified. IMPRESSION: Single living IUP measuring 9 weeks 1 day with US EDC of 03/18/2017. This is concordant with LMP. No significant maternal uterine or adnexal abnormality identified. Electronically Signed   By: Myles RosenthalJohn  Stahl M.D.   On: 08/14/2016 15:58    MAU COURSE Antiemetic US PO trial  1:50 PM - Entered room to check on patient, she was sitting in bed eating a chicken biscuit sandwich. States she feels all better after medication. Awaiting US to confirm IUP. If IUP seen, can be d/c'd with Unisom + B6.   MDM Plan of care reviewed with patient, including labs and tests ordered and medical treatment.   ASSESSMENT 1. Nausea and vomiting in pregnancy   2. Abdominal pain affecting pregnancy   3. Abdominal pain during pregnancy in first trimester     PLAN Discharge home in stable condition. Patient has medications at pharmacy  (meclizine and phenergan) waiting to be picked up. Also discussed use of OTC Vit B6 and Unisom.     Medication List    TAKE these medications   meclizine 25 MG tablet Commonly known as:  ANTIVERT Take 1 tablet (25 mg total) by mouth 3 (three) times daily as needed for nausea.   prenatal multivitamin Tabs tablet Take 1 tablet by mouth daily at 12 noon.   promethazine 25 MG tablet Commonly known as:  PHENERGAN Take 1 tablet (25 mg total) by mouth every 6 (six) hours as needed for nausea or vomiting.        [address]lizabeth Woodland WilliamsonMumaw, [place]DO 08/14/2016  1:29 PM

## 2016-08-14 NOTE — Discharge Instructions (Signed)
Morning Sickness Morning sickness is when you feel sick to your stomach (nauseous) during pregnancy. You may feel sick to your stomach and throw up (vomit). You may feel sick in the morning, but you can feel this way any time of day. Some women feel very sick to their stomach and cannot stop throwing up (hyperemesis gravidarum). HOME CARE  Only take medicines as told by your doctor.  Take multivitamins as told by your doctor. Taking multivitamins before getting pregnant can stop or lessen the harshness of morning sickness.  Eat dry toast or unsalted crackers before getting out of bed.  Eat 5 to 6 small meals a day.  Eat dry and bland foods like rice and baked potatoes.  Do not drink liquids with meals. Drink between meals.  Do not eat greasy, fatty, or spicy foods.  Have someone cook for you if the smell of food causes you to feel sick or throw up.  If you feel sick to your stomach after taking prenatal vitamins, take them at night or with a snack.  Eat protein when you need a snack (nuts, yogurt, cheese).  Eat unsweetened gelatins for dessert.  Wear a bracelet used for sea sickness (acupressure wristband).  Go to a doctor that puts thin needles into certain body points (acupuncture) to improve how you feel.  Do not smoke.  Use a humidifier to keep the air in your house free of odors.  Get lots of fresh air. GET HELP IF:  You need medicine to feel better.  You feel dizzy or lightheaded.  You are losing weight. GET HELP RIGHT AWAY IF:   You feel very sick to your stomach and cannot stop throwing up.  You pass out (faint). MAKE SURE YOU:  Understand these instructions.  Will watch your condition.  Will get help right away if you are not doing well or get worse.   This information is not intended to replace advice given to you by your health care provider. Make sure you discuss any questions you have with your health care provider.   Document Released: 11/29/2004  Document Revised: 11/12/2014 Document Reviewed: 04/08/2013 Elsevier Interactive Patient Education 2016 [employer]    SAFE MEDICATIONS IN PREGNANCY   Constipation:  Colace  Ducolax suppositories  Fleet enema  Glycerin suppositories  Metamucil  Milk of magnesia  Miralax  Senokot  Smooth move tea   Diarrhea:  Kaopectate  Imodium A-D   *NO pepto Bismol    Indigestion:  Tums  Maalox  Mylanta  Zantac  Pepcid   Insomnia:  Benadryl (alcohol free) 25mg  every 6 hours as needed  Tylenol PM  Unisom, no Gelcaps    Nausea/Vomiting:  Bonine  Dramamine  Emetrol  Ginger extract  Sea bands  Meclizine    Nausea medication to take during pregnancy:   UNISOM (doxylamine succinate 25 mg tablets) Take one tablet daily at bedtime. If symptoms are not adequately controlled, the dose can be increased to a maximum recommended dose of two tablets daily (1/2 tablet in the morning, 1/2 tablet mid-afternoon and one at bedtime).  VITAMIN B6  100mg  tablets. Take one tablet twice a day (up to 200 mg per day).

## 2016-09-24 ENCOUNTER — Encounter (HOSPITAL_COMMUNITY): Payer: Self-pay | Admitting: *Deleted

## 2017-03-19 IMAGING — US US OB TRANSVAGINAL
1 series · 15 of 28 positions shown · non-contrast
Comparison: None.

CLINICAL DATA: Abdominal pain in first trimester pregnancy.
Gestational age by LMP of 9 weeks 6 days.

EXAM:
OBSTETRIC <14 WK US AND TRANSVAGINAL OB US
TECHNIQUE: Both transabdominal and transvaginal ultrasound examinations were
performed for complete evaluation of the gestation as well as the
maternal uterus, adnexal regions, and pelvic cul-de-sac.
Transvaginal technique was performed to assess early pregnancy.

[Series 1: us ob transvaginal · 15 of 83 slices shown]
[im 1/83]
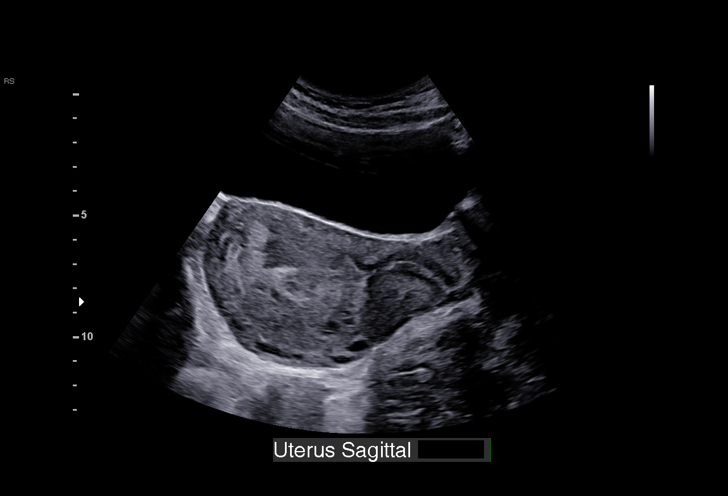
[im 7/83]
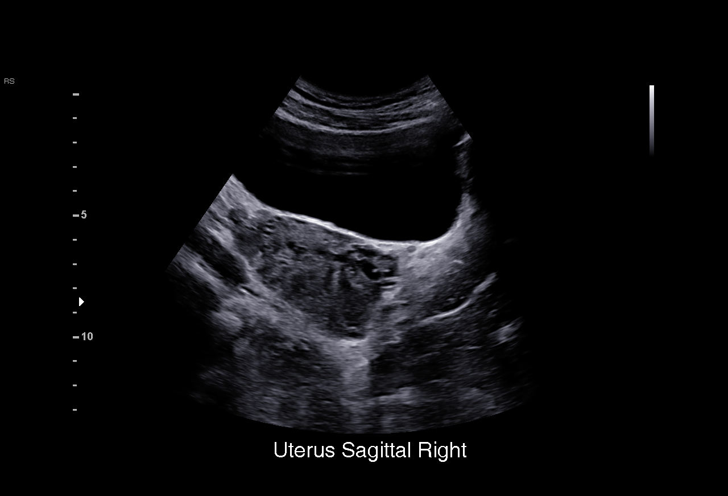
[im 13/83]
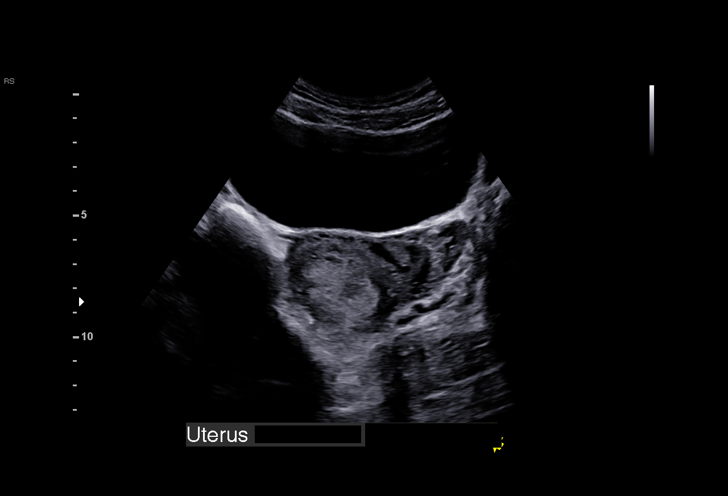
[im 19/83]
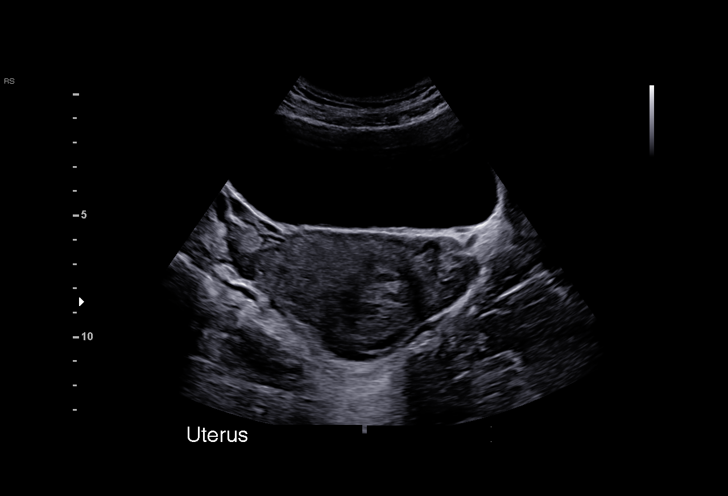
[im 25/83]
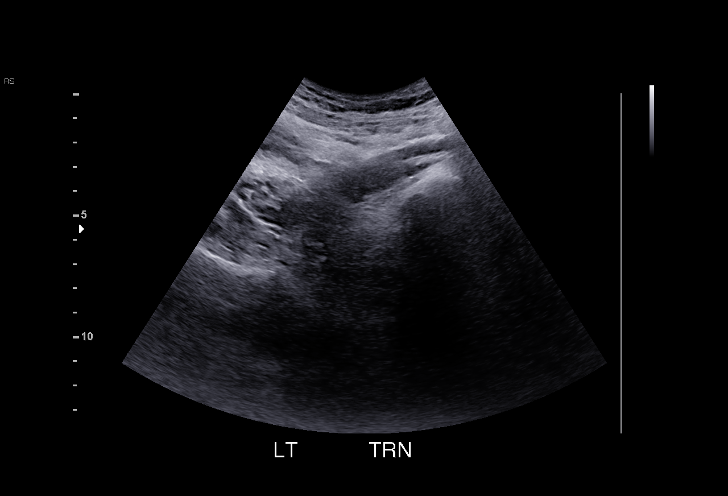
[im 31/83]
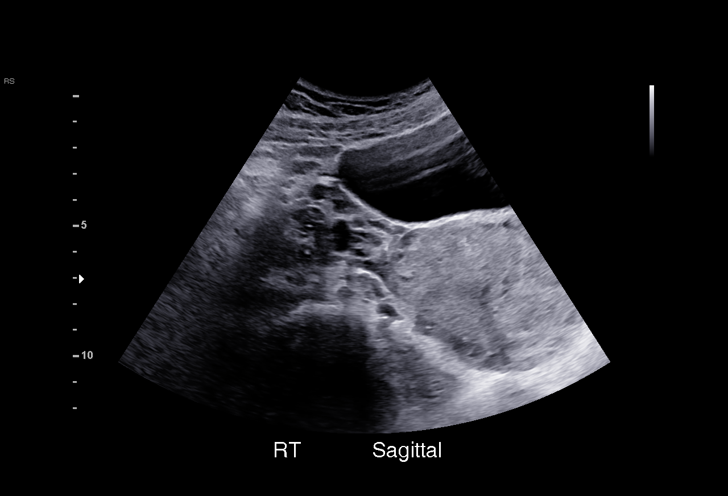
[im 37/83]
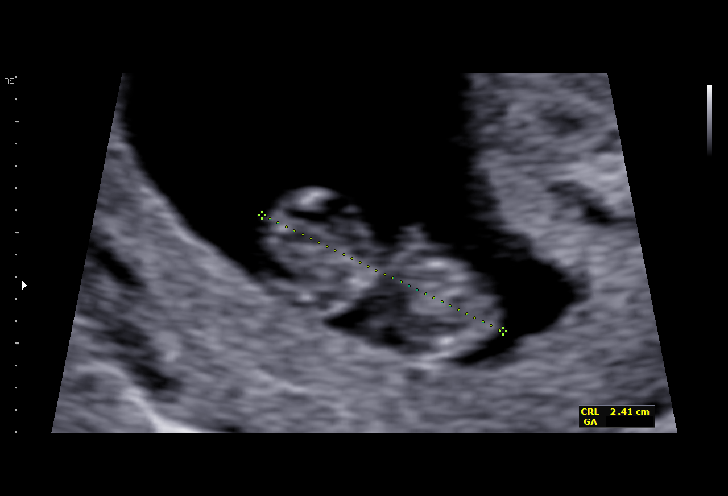
[im 43/83]
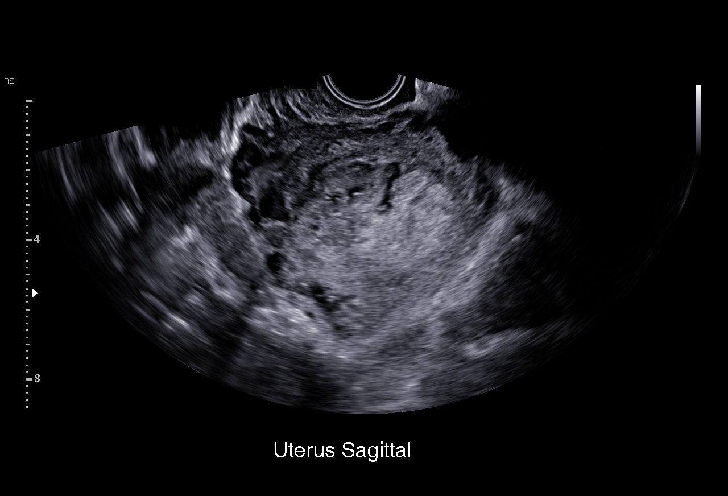
[im 46/83]
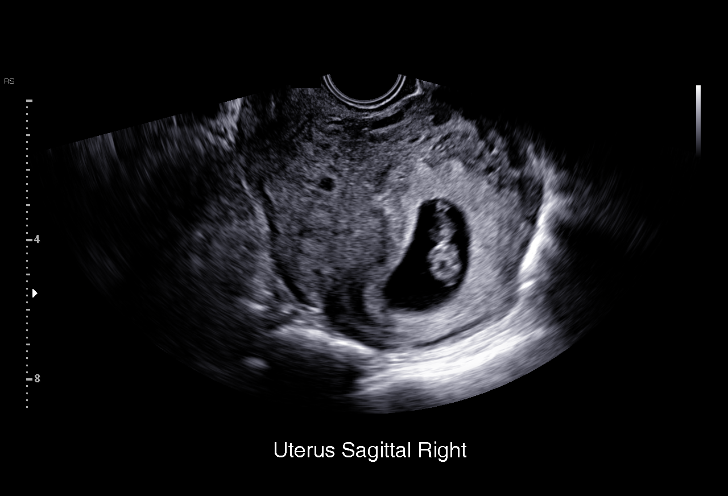
[im 52/83]
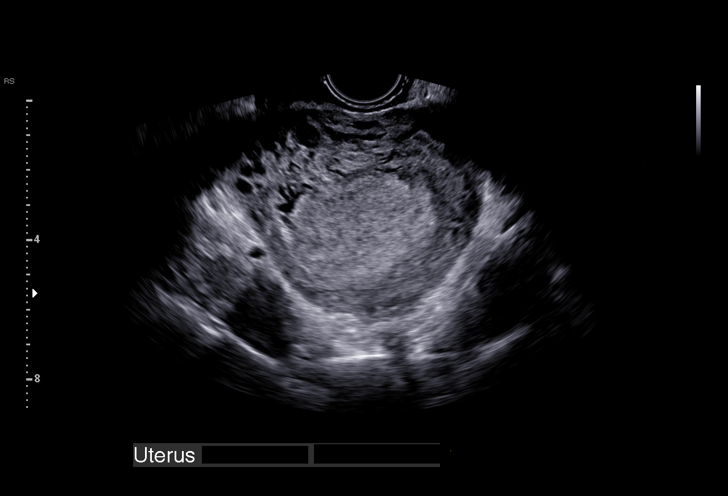
[im 58/83]
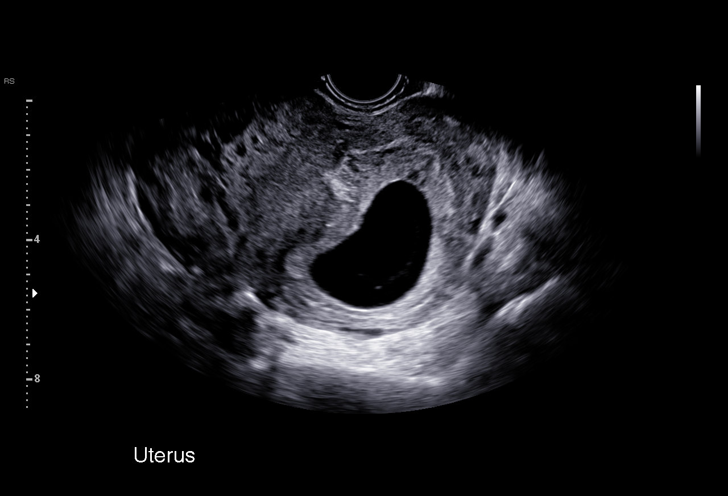
[im 64/83]
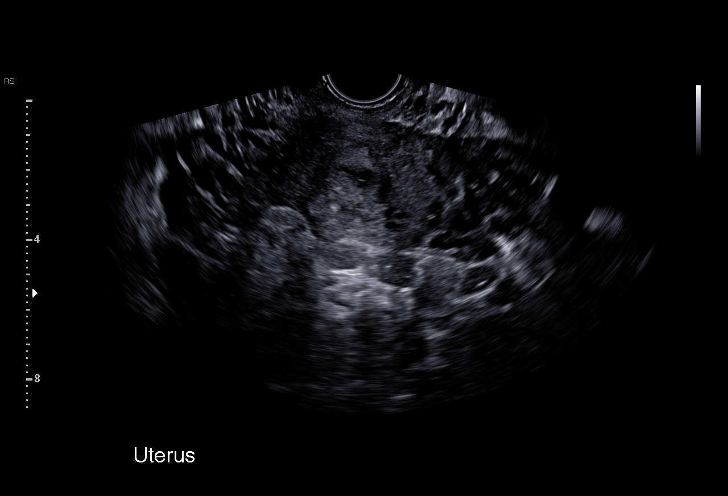
[im 70/83]
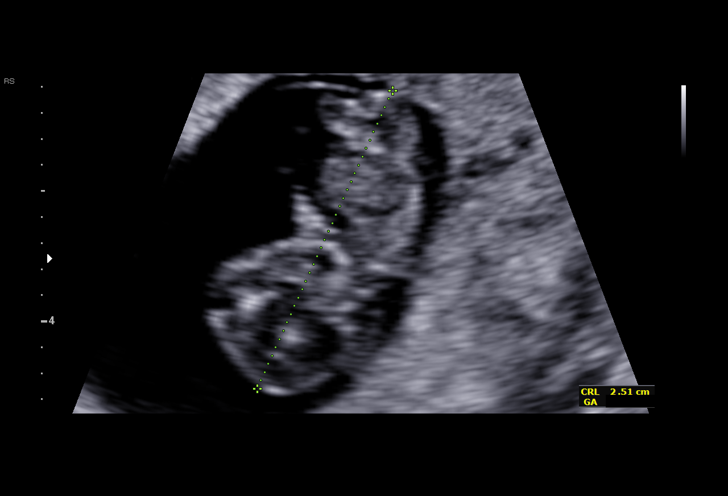
[im 76/83]
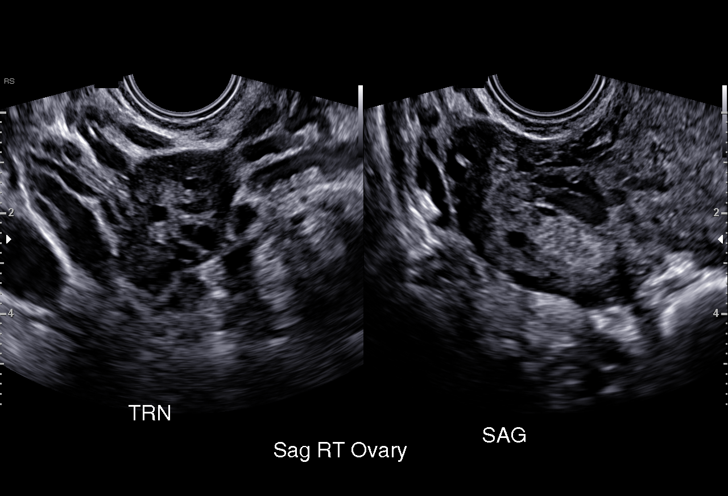
[im 83/83]
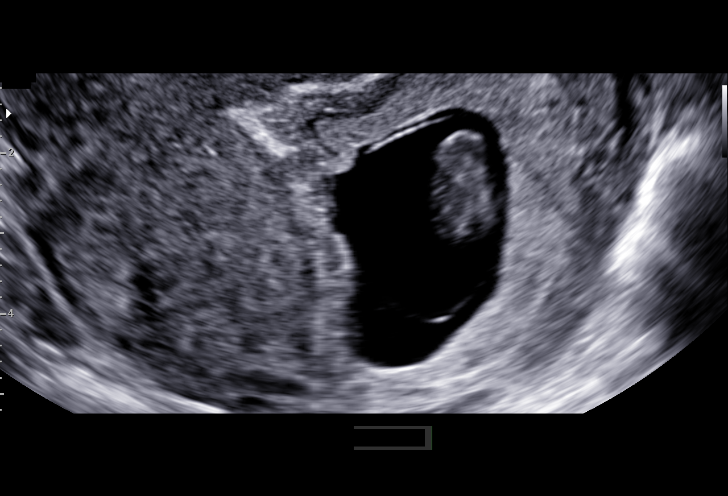

[15 of 28 positions shown; findings below may reference images not displayed]

FINDINGS: Intrauterine gestational sac: Single

Yolk sac:  Visualized

Embryo:  Visualized

Cardiac Activity: Visualized

Heart Rate: 180  bpm

CRL:  25  mm   9 w   1 d                  US EDC: 03/18/2017

Subchorionic hemorrhage:  None visualized.

Maternal uterus/adnexae: Retroverted uterus. Normal appearance of
both ovaries. No masses or abnormal free fluid identified.
IMPRESSION: Single living IUP measuring 9 weeks 1 day with US EDC of 03/18/2017.
This is concordant with LMP.

No significant maternal uterine or adnexal abnormality identified.

## 2017-06-01 ENCOUNTER — Encounter (HOSPITAL_COMMUNITY): Payer: Self-pay
